# Patient Record
Sex: Male | Born: 1950
Health system: Southern US, Community
[De-identification: ages and names within clinical notes are randomized; demographics above are authoritative.]

## PROBLEM LIST (undated history)

## (undated) DIAGNOSIS — F419 Anxiety disorder, unspecified: Secondary | ICD-10-CM

## (undated) DIAGNOSIS — G4733 Obstructive sleep apnea (adult) (pediatric): Secondary | ICD-10-CM

## (undated) DIAGNOSIS — J449 Chronic obstructive pulmonary disease, unspecified: Secondary | ICD-10-CM

## (undated) DIAGNOSIS — I10 Essential (primary) hypertension: Secondary | ICD-10-CM

---

## 2001-05-22 ENCOUNTER — Ambulatory Visit (HOSPITAL_COMMUNITY): Admission: RE | Admit: 2001-05-22 | Discharge: 2001-05-22 | Payer: Self-pay | Admitting: Internal Medicine

## 2005-04-29 ENCOUNTER — Ambulatory Visit (HOSPITAL_COMMUNITY): Admission: RE | Admit: 2005-04-29 | Discharge: 2005-04-29 | Payer: Self-pay | Admitting: Neurosurgery

## 2016-12-27 DIAGNOSIS — I10 Essential (primary) hypertension: Secondary | ICD-10-CM | POA: Diagnosis not present

## 2016-12-27 DIAGNOSIS — E782 Mixed hyperlipidemia: Secondary | ICD-10-CM | POA: Diagnosis not present

## 2016-12-27 DIAGNOSIS — K219 Gastro-esophageal reflux disease without esophagitis: Secondary | ICD-10-CM | POA: Diagnosis not present

## 2016-12-27 DIAGNOSIS — J449 Chronic obstructive pulmonary disease, unspecified: Secondary | ICD-10-CM | POA: Diagnosis not present

## 2017-01-02 DIAGNOSIS — J441 Chronic obstructive pulmonary disease with (acute) exacerbation: Secondary | ICD-10-CM | POA: Diagnosis not present

## 2017-05-03 DIAGNOSIS — J441 Chronic obstructive pulmonary disease with (acute) exacerbation: Secondary | ICD-10-CM | POA: Diagnosis not present

## 2017-12-27 DIAGNOSIS — J449 Chronic obstructive pulmonary disease, unspecified: Secondary | ICD-10-CM | POA: Diagnosis not present

## 2017-12-27 DIAGNOSIS — K219 Gastro-esophageal reflux disease without esophagitis: Secondary | ICD-10-CM | POA: Diagnosis not present

## 2017-12-27 DIAGNOSIS — E782 Mixed hyperlipidemia: Secondary | ICD-10-CM | POA: Diagnosis not present

## 2017-12-27 DIAGNOSIS — I10 Essential (primary) hypertension: Secondary | ICD-10-CM | POA: Diagnosis not present

## 2017-12-27 DIAGNOSIS — G4733 Obstructive sleep apnea (adult) (pediatric): Secondary | ICD-10-CM | POA: Diagnosis not present

## 2018-05-21 DIAGNOSIS — M25512 Pain in left shoulder: Secondary | ICD-10-CM | POA: Diagnosis not present

## 2018-07-03 DIAGNOSIS — K219 Gastro-esophageal reflux disease without esophagitis: Secondary | ICD-10-CM | POA: Diagnosis not present

## 2018-07-03 DIAGNOSIS — G4733 Obstructive sleep apnea (adult) (pediatric): Secondary | ICD-10-CM | POA: Diagnosis not present

## 2018-07-03 DIAGNOSIS — Z23 Encounter for immunization: Secondary | ICD-10-CM | POA: Diagnosis not present

## 2018-07-03 DIAGNOSIS — J449 Chronic obstructive pulmonary disease, unspecified: Secondary | ICD-10-CM | POA: Diagnosis not present

## 2018-07-03 DIAGNOSIS — I10 Essential (primary) hypertension: Secondary | ICD-10-CM | POA: Diagnosis not present

## 2018-07-03 DIAGNOSIS — E782 Mixed hyperlipidemia: Secondary | ICD-10-CM | POA: Diagnosis not present

## 2018-07-17 DIAGNOSIS — J441 Chronic obstructive pulmonary disease with (acute) exacerbation: Secondary | ICD-10-CM | POA: Diagnosis not present

## 2018-07-17 DIAGNOSIS — H00014 Hordeolum externum left upper eyelid: Secondary | ICD-10-CM | POA: Diagnosis not present

## 2018-07-22 DIAGNOSIS — M7981 Nontraumatic hematoma of soft tissue: Secondary | ICD-10-CM | POA: Diagnosis not present

## 2018-07-22 DIAGNOSIS — R109 Unspecified abdominal pain: Secondary | ICD-10-CM | POA: Diagnosis not present

## 2018-07-22 DIAGNOSIS — S301XXA Contusion of abdominal wall, initial encounter: Secondary | ICD-10-CM | POA: Diagnosis not present

## 2018-09-13 DIAGNOSIS — Z122 Encounter for screening for malignant neoplasm of respiratory organs: Secondary | ICD-10-CM | POA: Diagnosis not present

## 2018-09-13 DIAGNOSIS — I2510 Atherosclerotic heart disease of native coronary artery without angina pectoris: Secondary | ICD-10-CM | POA: Diagnosis not present

## 2018-09-13 DIAGNOSIS — Z87891 Personal history of nicotine dependence: Secondary | ICD-10-CM | POA: Diagnosis not present

## 2018-09-13 DIAGNOSIS — J439 Emphysema, unspecified: Secondary | ICD-10-CM | POA: Diagnosis not present

## 2018-11-06 DIAGNOSIS — R0602 Shortness of breath: Secondary | ICD-10-CM | POA: Diagnosis not present

## 2018-11-06 DIAGNOSIS — J449 Chronic obstructive pulmonary disease, unspecified: Secondary | ICD-10-CM | POA: Diagnosis not present

## 2018-11-06 DIAGNOSIS — J9611 Chronic respiratory failure with hypoxia: Secondary | ICD-10-CM | POA: Diagnosis not present

## 2018-11-14 DIAGNOSIS — J449 Chronic obstructive pulmonary disease, unspecified: Secondary | ICD-10-CM | POA: Diagnosis not present

## 2018-11-14 DIAGNOSIS — J9611 Chronic respiratory failure with hypoxia: Secondary | ICD-10-CM | POA: Diagnosis not present

## 2018-11-16 DIAGNOSIS — L0231 Cutaneous abscess of buttock: Secondary | ICD-10-CM | POA: Diagnosis not present

## 2018-12-13 DIAGNOSIS — J9611 Chronic respiratory failure with hypoxia: Secondary | ICD-10-CM | POA: Diagnosis not present

## 2018-12-13 DIAGNOSIS — J449 Chronic obstructive pulmonary disease, unspecified: Secondary | ICD-10-CM | POA: Diagnosis not present

## 2018-12-15 DIAGNOSIS — J9611 Chronic respiratory failure with hypoxia: Secondary | ICD-10-CM | POA: Diagnosis not present

## 2018-12-15 DIAGNOSIS — J449 Chronic obstructive pulmonary disease, unspecified: Secondary | ICD-10-CM | POA: Diagnosis not present

## 2019-01-11 ENCOUNTER — Encounter: Payer: Self-pay | Admitting: Pulmonary Disease

## 2019-01-11 ENCOUNTER — Ambulatory Visit (INDEPENDENT_AMBULATORY_CARE_PROVIDER_SITE_OTHER): Payer: Medicare Other

## 2019-01-11 ENCOUNTER — Ambulatory Visit (INDEPENDENT_AMBULATORY_CARE_PROVIDER_SITE_OTHER): Payer: Medicare Other | Admitting: Pulmonary Disease

## 2019-01-11 ENCOUNTER — Other Ambulatory Visit: Payer: Self-pay

## 2019-01-11 VITALS — BP 130/80 | HR 75 | Ht 68.0 in | Wt 202.6 lb

## 2019-01-11 DIAGNOSIS — J449 Chronic obstructive pulmonary disease, unspecified: Secondary | ICD-10-CM

## 2019-01-11 MED ORDER — FLUTICASONE-UMECLIDIN-VILANT 100-62.5-25 MCG/INH IN AEPB: 1.0000 | INHALATION_SPRAY | Freq: Every day | RESPIRATORY_TRACT | 5 refills | Status: AC

## 2019-01-11 MED ORDER — FLUTICASONE-UMECLIDIN-VILANT 100-62.5-25 MCG/INH IN AEPB: 1.0000 | INHALATION_SPRAY | Freq: Every day | RESPIRATORY_TRACT | 0 refills | Status: AC

## 2019-01-11 MED ORDER — PREDNISONE 10 MG PO TABS: ORAL_TABLET | ORAL | 0 refills | Status: AC

## 2019-01-11 MED ORDER — NICOTINE 21-14-7 MG/24HR TD KIT: PACK | TRANSDERMAL | 0 refills | Status: AC

## 2019-01-11 NOTE — Progress Notes (Signed)
Brent Carter    [id number]    07-31-1951  Primary Care Physician:System, Pcp Not In  Referring Physician: Gus Height, PA-C [address] [place], [place] [address]  Chief complaint: Consult for COPD  HPI: 68 year old active smoker with history of COPD, hypertension, hyperlipidemia, peripheral vascular disease, diverticulitis sleep apnea Complains of dyspnea on exertion, chronic cough with clear mucus.  Denies any fevers, chills, recent travel.  Per primary note he has tried and failed multiple inhalers including Trelegy, Tudorza, duo nebs, Anoro, Ventolin, Spiriva and Yupelri.  Currently on Dulera and pro-air.  Use the rescue medication several times a day  Has generalized anxiety disorder.  His anxiety tends to make his breathing worse Continues to smoke half pack per day. Has history of sleep apnea but does not want to use the CPAP.  He uses oxygen at night and occasionally during daytime.  Pets: No pets Occupation: Runs a towing company Exposures: No known exposures, no mold, hot tub, Jacuzzi Smoking history: 70-100-pack-year smoker.  Continues to smoke half pack per day Travel history: No significant travel history Relevant family history: No significant family history of lung disease.  Outpatient Encounter Medications as of 01/11/2019  Medication Sig  . albuterol (PROAIR HFA) 108 (90 Base) MCG/ACT inhaler Inhale into the lungs every 6 (six) hours as needed for wheezing or shortness of breath.  Marland Kitchen buPROPion (WELLBUTRIN XL) 150 MG 24 hr tablet Take 150 mg by mouth daily.  . clopidogrel (PLAVIX) 75 MG tablet Take 75 mg by mouth daily.  Marland Kitchen losartan-hydrochlorothiazide (HYZAAR) 100-25 MG tablet Take 1 tablet by mouth daily.  . mometasone-formoterol (DULERA) 200-5 MCG/ACT AERO Inhale 2 puffs into the lungs 2 (two) times daily.  . Omega-3 Fatty Acids (FISH OIL) 1000 MG CAPS Take by mouth daily.  . simethicone (MYLICON) 125 MG chewable tablet Chew 125 mg by mouth  every 6 (six) hours as needed for flatulence.   No facility-administered encounter medications on file as of 01/11/2019.     Allergies as of 01/11/2019  . (No Known Allergies)    No past medical history on file.  History reviewed. No pertinent surgical history.  No family history on file.  Social History   Socioeconomic History  . Marital status: Single    Spouse name: Not on file  . Number of children: Not on file  . Years of education: Not on file  . Highest education level: Not on file  Occupational History  . Not on file  Social Needs  . Financial resource strain: Not on file  . Food insecurity:    Worry: Not on file    Inability: Not on file  . Transportation needs:    Medical: Not on file    Non-medical: Not on file  Tobacco Use  . Smoking status: Current Every Day Smoker    Packs/day: 0.50    Years: 56.00    Pack years: 28.00    Types: Cigarettes  . Smokeless tobacco: Never Used  . Tobacco comment: started at 68 yrs old  Substance and Sexual Activity  . Alcohol use: Not on file  . Drug use: Not on file  . Sexual activity: Not on file  Lifestyle  . Physical activity:    Days per week: Not on file    Minutes per session: Not on file  . Stress: Not on file  Relationships  . Social connections:    Talks on phone: Not on file  Gets together: Not on file    Attends religious service: Not on file    Active member of club or organization: Not on file    Attends meetings of clubs or organizations: Not on file    Relationship status: Not on file  . Intimate partner violence:    Fear of current or ex partner: Not on file    Emotionally abused: Not on file    Physically abused: Not on file    Forced sexual activity: Not on file  Other Topics Concern  . Not on file  Social History Narrative  . Not on file    Review of systems: Review of Systems  Constitutional: Negative for fever and chills.  HENT: Negative.   Eyes: Negative for blurred vision.   Respiratory: as per HPI  Cardiovascular: Negative for chest pain and palpitations.  Gastrointestinal: Negative for vomiting, diarrhea, blood per rectum. Genitourinary: Negative for dysuria, urgency, frequency and hematuria.  Musculoskeletal: Negative for myalgias, back pain and joint pain.  Skin: Negative for itching and rash.  Neurological: Negative for dizziness, tremors, focal weakness, seizures and loss of consciousness.  Endo/Heme/Allergies: Negative for environmental allergies.  Psychiatric/Behavioral: Negative for depression, suicidal ideas and hallucinations.  All other systems reviewed and are negative.  Physical Exam: Blood pressure 130/80, pulse 75, height 5\' 8"  (1.727 m), weight 202 lb 9.6 oz (91.9 kg), SpO2 95 %. Gen:      No acute distress HEENT:  EOMI, sclera anicteric Neck:     No masses; no thyromegaly Lungs:    Scattered expiratory wheeze CV:         Regular rate and rhythm; no murmurs Abd:      + bowel sounds; soft, non-tender; no palpable masses, no distension Ext:    No edema; adequate peripheral perfusion Skin:      Warm and dry; no rash Neuro: alert and oriented x 3 Psych: normal mood and affect  Data Reviewed: Imaging: Screening CT chest 09/13/2018-severe centrilobular emphysema, bronchial wall thickening.  Scattered pulmonary nodules.  Largest is 5.6 mm and right lower lobe.  I have reviewed the images personally.   Assessment:  COPD Likely has severe COPD based on symptoms and CT findings of emphysema He has slight wheeze on examination today.  We will give him a prednisone taper and will retry him on Trelegy inhaler We will schedule him for PFTs but these will likely not get done immediately due to COVID restrictions. Get chest x-ray today  Active smoker Smoking cessation strongly encouraged.  He had tried Chantix and Wellbutrin in the past.  Will prescribe nicotine patches.  Reassess at return visit.  Time spent counseling- 5 mins  Lung nodule  Annual screening CT of the chest.  Plan/Recommendations: - PFTs, chest x-ray - Prednisone taper - Trelegy inhaler - Smoking cessation with nicotine patches.  Chilton GreathousePraveen Alaisha Eversley MD [facility] 01/11/2019, 11:14 AM  CC: Gus HeightJohnson, Andrea, PA-C

## 2019-01-11 NOTE — Addendum Note (Signed)
Addended by: Jacquiline Doe on: 01/11/2019 11:55 AM   Modules accepted: Orders

## 2019-01-11 NOTE — Addendum Note (Signed)
Addended by: Jacquiline Doe on: 01/11/2019 11:47 AM   Modules accepted: Orders

## 2019-01-11 NOTE — Patient Instructions (Signed)
We will schedule you for PFTs but this may take a while to schedule due to covid restrictions We will get a chest x-ray today We will give a prednisone taper starting at 40 mg.  Reduce dose by 10 mg every 3 days We will retry you on Trelegy inhaler  Prescribe nicotine patches to help with smoking cessation Follow-up in 1 to 2 months.

## 2019-01-14 DIAGNOSIS — J9611 Chronic respiratory failure with hypoxia: Secondary | ICD-10-CM | POA: Diagnosis not present

## 2019-01-14 DIAGNOSIS — J449 Chronic obstructive pulmonary disease, unspecified: Secondary | ICD-10-CM | POA: Diagnosis not present

## 2019-01-15 DIAGNOSIS — J449 Chronic obstructive pulmonary disease, unspecified: Secondary | ICD-10-CM | POA: Diagnosis not present

## 2019-01-28 DIAGNOSIS — J449 Chronic obstructive pulmonary disease, unspecified: Secondary | ICD-10-CM | POA: Diagnosis not present

## 2019-01-30 DIAGNOSIS — J449 Chronic obstructive pulmonary disease, unspecified: Secondary | ICD-10-CM | POA: Diagnosis not present

## 2019-01-30 DIAGNOSIS — R072 Precordial pain: Secondary | ICD-10-CM | POA: Diagnosis not present

## 2019-01-30 DIAGNOSIS — E782 Mixed hyperlipidemia: Secondary | ICD-10-CM | POA: Diagnosis not present

## 2019-01-30 DIAGNOSIS — I10 Essential (primary) hypertension: Secondary | ICD-10-CM | POA: Diagnosis not present

## 2019-02-08 DIAGNOSIS — R570 Cardiogenic shock: Secondary | ICD-10-CM | POA: Diagnosis not present

## 2019-02-08 DIAGNOSIS — I10 Essential (primary) hypertension: Secondary | ICD-10-CM | POA: Diagnosis not present

## 2019-02-08 DIAGNOSIS — R0689 Other abnormalities of breathing: Secondary | ICD-10-CM | POA: Diagnosis not present

## 2019-02-08 DIAGNOSIS — Z9981 Dependence on supplemental oxygen: Secondary | ICD-10-CM | POA: Diagnosis not present

## 2019-02-08 DIAGNOSIS — E785 Hyperlipidemia, unspecified: Secondary | ICD-10-CM | POA: Diagnosis not present

## 2019-02-08 DIAGNOSIS — J449 Chronic obstructive pulmonary disease, unspecified: Secondary | ICD-10-CM | POA: Diagnosis not present

## 2019-02-08 DIAGNOSIS — R0602 Shortness of breath: Secondary | ICD-10-CM | POA: Diagnosis not present

## 2019-02-08 DIAGNOSIS — Z7902 Long term (current) use of antithrombotics/antiplatelets: Secondary | ICD-10-CM | POA: Diagnosis not present

## 2019-02-08 DIAGNOSIS — R0682 Tachypnea, not elsewhere classified: Secondary | ICD-10-CM | POA: Diagnosis not present

## 2019-02-08 DIAGNOSIS — R404 Transient alteration of awareness: Secondary | ICD-10-CM | POA: Diagnosis not present

## 2019-02-08 DIAGNOSIS — Z03818 Encounter for observation for suspected exposure to other biological agents ruled out: Secondary | ICD-10-CM | POA: Diagnosis not present

## 2019-02-08 DIAGNOSIS — Z79899 Other long term (current) drug therapy: Secondary | ICD-10-CM | POA: Diagnosis not present

## 2019-02-08 DIAGNOSIS — R000 Tachycardia, unspecified: Secondary | ICD-10-CM | POA: Diagnosis not present

## 2019-02-08 DIAGNOSIS — I2101 ST elevation (STEMI) myocardial infarction involving left main coronary artery: Secondary | ICD-10-CM | POA: Diagnosis not present

## 2019-02-08 DIAGNOSIS — J9601 Acute respiratory failure with hypoxia: Secondary | ICD-10-CM | POA: Diagnosis not present

## 2019-02-08 DIAGNOSIS — I739 Peripheral vascular disease, unspecified: Secondary | ICD-10-CM | POA: Diagnosis not present

## 2019-02-09 ENCOUNTER — Inpatient Hospital Stay (HOSPITAL_COMMUNITY): Payer: Medicare Other

## 2019-02-09 ENCOUNTER — Encounter (HOSPITAL_COMMUNITY): Payer: Self-pay | Admitting: Pulmonary Disease

## 2019-02-09 ENCOUNTER — Other Ambulatory Visit (HOSPITAL_COMMUNITY): Payer: Medicare Other

## 2019-02-09 ENCOUNTER — Other Ambulatory Visit: Payer: Self-pay

## 2019-02-09 ENCOUNTER — Inpatient Hospital Stay (HOSPITAL_COMMUNITY): Admission: AD | Disposition: E | Payer: Self-pay | Source: Other Acute Inpatient Hospital | Attending: Pulmonary Disease

## 2019-02-09 DIAGNOSIS — I130 Hypertensive heart and chronic kidney disease with heart failure and stage 1 through stage 4 chronic kidney disease, or unspecified chronic kidney disease: Secondary | ICD-10-CM | POA: Diagnosis present

## 2019-02-09 DIAGNOSIS — I213 ST elevation (STEMI) myocardial infarction of unspecified site: Secondary | ICD-10-CM

## 2019-02-09 DIAGNOSIS — G4733 Obstructive sleep apnea (adult) (pediatric): Secondary | ICD-10-CM | POA: Diagnosis not present

## 2019-02-09 DIAGNOSIS — J811 Chronic pulmonary edema: Secondary | ICD-10-CM | POA: Diagnosis not present

## 2019-02-09 DIAGNOSIS — Y848 Other medical procedures as the cause of abnormal reaction of the patient, or of later complication, without mention of misadventure at the time of the procedure: Secondary | ICD-10-CM | POA: Diagnosis not present

## 2019-02-09 DIAGNOSIS — D62 Acute posthemorrhagic anemia: Secondary | ICD-10-CM | POA: Diagnosis not present

## 2019-02-09 DIAGNOSIS — Z978 Presence of other specified devices: Secondary | ICD-10-CM | POA: Diagnosis not present

## 2019-02-09 DIAGNOSIS — Z9282 Status post administration of tPA (rtPA) in a different facility within the last 24 hours prior to admission to current facility: Secondary | ICD-10-CM

## 2019-02-09 DIAGNOSIS — R9431 Abnormal electrocardiogram [ECG] [EKG]: Secondary | ICD-10-CM

## 2019-02-09 DIAGNOSIS — I5021 Acute systolic (congestive) heart failure: Secondary | ICD-10-CM

## 2019-02-09 DIAGNOSIS — Z9289 Personal history of other medical treatment: Secondary | ICD-10-CM

## 2019-02-09 DIAGNOSIS — Y712 Prosthetic and other implants, materials and accessory cardiovascular devices associated with adverse incidents: Secondary | ICD-10-CM | POA: Diagnosis not present

## 2019-02-09 DIAGNOSIS — E785 Hyperlipidemia, unspecified: Secondary | ICD-10-CM | POA: Diagnosis present

## 2019-02-09 DIAGNOSIS — J810 Acute pulmonary edema: Secondary | ICD-10-CM | POA: Diagnosis not present

## 2019-02-09 DIAGNOSIS — R570 Cardiogenic shock: Secondary | ICD-10-CM | POA: Diagnosis not present

## 2019-02-09 DIAGNOSIS — Z7902 Long term (current) use of antithrombotics/antiplatelets: Secondary | ICD-10-CM | POA: Diagnosis not present

## 2019-02-09 DIAGNOSIS — I739 Peripheral vascular disease, unspecified: Secondary | ICD-10-CM | POA: Diagnosis present

## 2019-02-09 DIAGNOSIS — T82838A Hemorrhage of vascular prosthetic devices, implants and grafts, initial encounter: Secondary | ICD-10-CM | POA: Diagnosis not present

## 2019-02-09 DIAGNOSIS — Z01818 Encounter for other preprocedural examination: Secondary | ICD-10-CM

## 2019-02-09 DIAGNOSIS — Z8674 Personal history of sudden cardiac arrest: Secondary | ICD-10-CM

## 2019-02-09 DIAGNOSIS — J9601 Acute respiratory failure with hypoxia: Secondary | ICD-10-CM | POA: Diagnosis not present

## 2019-02-09 DIAGNOSIS — F419 Anxiety disorder, unspecified: Secondary | ICD-10-CM | POA: Diagnosis present

## 2019-02-09 DIAGNOSIS — Z79899 Other long term (current) drug therapy: Secondary | ICD-10-CM | POA: Diagnosis not present

## 2019-02-09 DIAGNOSIS — J189 Pneumonia, unspecified organism: Secondary | ICD-10-CM | POA: Diagnosis not present

## 2019-02-09 DIAGNOSIS — N183 Chronic kidney disease, stage 3 (moderate): Secondary | ICD-10-CM | POA: Diagnosis not present

## 2019-02-09 DIAGNOSIS — Z9981 Dependence on supplemental oxygen: Secondary | ICD-10-CM | POA: Diagnosis not present

## 2019-02-09 DIAGNOSIS — J90 Pleural effusion, not elsewhere classified: Secondary | ICD-10-CM | POA: Diagnosis not present

## 2019-02-09 DIAGNOSIS — N179 Acute kidney failure, unspecified: Secondary | ICD-10-CM | POA: Diagnosis not present

## 2019-02-09 DIAGNOSIS — I2109 ST elevation (STEMI) myocardial infarction involving other coronary artery of anterior wall: Principal | ICD-10-CM | POA: Diagnosis present

## 2019-02-09 DIAGNOSIS — J441 Chronic obstructive pulmonary disease with (acute) exacerbation: Secondary | ICD-10-CM | POA: Diagnosis not present

## 2019-02-09 DIAGNOSIS — R0602 Shortness of breath: Secondary | ICD-10-CM | POA: Diagnosis not present

## 2019-02-09 DIAGNOSIS — Z4682 Encounter for fitting and adjustment of non-vascular catheter: Secondary | ICD-10-CM | POA: Diagnosis not present

## 2019-02-09 DIAGNOSIS — F17210 Nicotine dependence, cigarettes, uncomplicated: Secondary | ICD-10-CM | POA: Diagnosis present

## 2019-02-09 DIAGNOSIS — I2510 Atherosclerotic heart disease of native coronary artery without angina pectoris: Secondary | ICD-10-CM | POA: Diagnosis not present

## 2019-02-09 DIAGNOSIS — T829XXA Unspecified complication of cardiac and vascular prosthetic device, implant and graft, initial encounter: Secondary | ICD-10-CM

## 2019-02-09 DIAGNOSIS — J9602 Acute respiratory failure with hypercapnia: Secondary | ICD-10-CM | POA: Diagnosis not present

## 2019-02-09 DIAGNOSIS — J984 Other disorders of lung: Secondary | ICD-10-CM | POA: Diagnosis not present

## 2019-02-09 DIAGNOSIS — R0603 Acute respiratory distress: Secondary | ICD-10-CM

## 2019-02-09 DIAGNOSIS — I255 Ischemic cardiomyopathy: Secondary | ICD-10-CM | POA: Diagnosis present

## 2019-02-09 DIAGNOSIS — I2102 ST elevation (STEMI) myocardial infarction involving left anterior descending coronary artery: Secondary | ICD-10-CM | POA: Diagnosis not present

## 2019-02-09 DIAGNOSIS — E876 Hypokalemia: Secondary | ICD-10-CM | POA: Diagnosis not present

## 2019-02-09 DIAGNOSIS — I2101 ST elevation (STEMI) myocardial infarction involving left main coronary artery: Secondary | ICD-10-CM | POA: Diagnosis not present

## 2019-02-09 DIAGNOSIS — J449 Chronic obstructive pulmonary disease, unspecified: Secondary | ICD-10-CM | POA: Diagnosis not present

## 2019-02-09 DIAGNOSIS — Z7951 Long term (current) use of inhaled steroids: Secondary | ICD-10-CM

## 2019-02-09 DIAGNOSIS — J9690 Respiratory failure, unspecified, unspecified whether with hypoxia or hypercapnia: Secondary | ICD-10-CM | POA: Diagnosis not present

## 2019-02-09 DIAGNOSIS — J9811 Atelectasis: Secondary | ICD-10-CM | POA: Diagnosis not present

## 2019-02-09 DIAGNOSIS — D6489 Other specified anemias: Secondary | ICD-10-CM | POA: Diagnosis not present

## 2019-02-09 DIAGNOSIS — I517 Cardiomegaly: Secondary | ICD-10-CM | POA: Diagnosis not present

## 2019-02-09 DIAGNOSIS — J9611 Chronic respiratory failure with hypoxia: Secondary | ICD-10-CM | POA: Diagnosis not present

## 2019-02-09 DIAGNOSIS — I469 Cardiac arrest, cause unspecified: Secondary | ICD-10-CM | POA: Diagnosis not present

## 2019-02-09 DIAGNOSIS — J432 Centrilobular emphysema: Secondary | ICD-10-CM | POA: Diagnosis not present

## 2019-02-09 DIAGNOSIS — Z452 Encounter for adjustment and management of vascular access device: Secondary | ICD-10-CM | POA: Diagnosis not present

## 2019-02-09 HISTORY — DX: Chronic obstructive pulmonary disease, unspecified: J44.9

## 2019-02-09 HISTORY — PX: LEFT HEART CATH AND CORONARY ANGIOGRAPHY: CATH118249

## 2019-02-09 HISTORY — DX: Anxiety disorder, unspecified: F41.9

## 2019-02-09 HISTORY — DX: Essential (primary) hypertension: I10

## 2019-02-09 HISTORY — DX: Obstructive sleep apnea (adult) (pediatric): G47.33

## 2019-02-09 LAB — COOXEMETRY PANEL
Carboxyhemoglobin: 1.1 % (ref 0.5–1.5)
Carboxyhemoglobin: 1.6 % — ABNORMAL HIGH (ref 0.5–1.5)
Methemoglobin: 0.8 % (ref 0.0–1.5)
Methemoglobin: 0.9 % (ref 0.0–1.5)
O2 Saturation: 89.7 %
O2 Saturation: 73.1 %
Total hemoglobin: 14.2 g/dL (ref 12.0–16.0)
Total hemoglobin: 13.7 g/dL (ref 12.0–16.0)

## 2019-02-09 LAB — PROTIME-INR
INR: 1.3 — ABNORMAL HIGH (ref 0.8–1.2)
Prothrombin Time: 16.2 seconds — ABNORMAL HIGH (ref 11.4–15.2)

## 2019-02-09 LAB — COMPREHENSIVE METABOLIC PANEL
ALT: 186 U/L — ABNORMAL HIGH (ref 0–44)
AST: 204 U/L — ABNORMAL HIGH (ref 15–41)
Albumin: 2.9 g/dL — ABNORMAL LOW (ref 3.5–5.0)
Alkaline Phosphatase: 80 U/L (ref 38–126)
Anion gap: 12 (ref 5–15)
BUN: 21 mg/dL (ref 8–23)
CO2: 22 mmol/L (ref 22–32)
Calcium: 8.4 mg/dL — ABNORMAL LOW (ref 8.9–10.3)
Chloride: 100 mmol/L (ref 98–111)
Creatinine, Ser: 1.51 mg/dL — ABNORMAL HIGH (ref 0.61–1.24)
GFR calc Af Amer: 54 mL/min — ABNORMAL LOW (ref >60)
GFR calc non Af Amer: 46 mL/min — ABNORMAL LOW (ref >60)
Glucose, Bld: 143 mg/dL — ABNORMAL HIGH (ref 70–99)
Potassium: 4.3 mmol/L (ref 3.5–5.1)
Sodium: 134 mmol/L — ABNORMAL LOW (ref 135–145)
Total Bilirubin: 1 mg/dL (ref 0.3–1.2)
Total Protein: 5.7 g/dL — ABNORMAL LOW (ref 6.5–8.1)

## 2019-02-09 LAB — URINALYSIS, MICROSCOPIC (REFLEX): Squamous Epithelial / HPF: NONE SEEN (ref 0–5)

## 2019-02-09 LAB — HIV ANTIBODY (ROUTINE TESTING W REFLEX): HIV Screen 4th Generation wRfx: NONREACTIVE

## 2019-02-09 LAB — CBC WITH DIFFERENTIAL/PLATELET
Abs Immature Granulocytes: 0.17 10*3/uL — ABNORMAL HIGH (ref 0.00–0.07)
Basophils Absolute: 0 10*3/uL (ref 0.0–0.1)
Basophils Relative: 0 %
Eosinophils Absolute: 0 10*3/uL (ref 0.0–0.5)
Eosinophils Relative: 0 %
HCT: 43 % (ref 39.0–52.0)
Hemoglobin: 14 g/dL (ref 13.0–17.0)
Immature Granulocytes: 1 %
Lymphocytes Relative: 5 %
Lymphs Abs: 0.8 10*3/uL (ref 0.7–4.0)
MCH: 30.4 pg (ref 26.0–34.0)
MCHC: 32.6 g/dL (ref 30.0–36.0)
MCV: 93.5 fL (ref 80.0–100.0)
Monocytes Absolute: 0.9 10*3/uL (ref 0.1–1.0)
Monocytes Relative: 5 %
Neutro Abs: 16.1 10*3/uL — ABNORMAL HIGH (ref 1.7–7.7)
Neutrophils Relative %: 89 %
Platelets: 270 10*3/uL (ref 150–400)
RBC: 4.6 MIL/uL (ref 4.22–5.81)
RDW: 13.6 % (ref 11.5–15.5)
WBC: 18 10*3/uL — ABNORMAL HIGH (ref 4.0–10.5)
nRBC: 0 % (ref 0.0–0.2)

## 2019-02-09 LAB — LIPID PANEL
Cholesterol: 115 mg/dL (ref 0–200)
HDL: 36 mg/dL — ABNORMAL LOW (ref >40)
LDL Cholesterol: 71 mg/dL (ref 0–99)
Total CHOL/HDL Ratio: 3.2 RATIO
Triglycerides: 42 mg/dL (ref <150)
VLDL: 8 mg/dL (ref 0–40)

## 2019-02-09 LAB — MAGNESIUM: Magnesium: 2 mg/dL (ref 1.7–2.4)

## 2019-02-09 LAB — POCT I-STAT EG7
Acid-base deficit: 4 mmol/L — ABNORMAL HIGH (ref 0.0–2.0)
Bicarbonate: 24.1 mmol/L (ref 20.0–28.0)
Calcium, Ion: 1.2 mmol/L (ref 1.15–1.40)
HCT: 42 % (ref 39.0–52.0)
Hemoglobin: 14.3 g/dL (ref 13.0–17.0)
O2 Saturation: 66 %
Patient temperature: 99
Potassium: 4.4 mmol/L (ref 3.5–5.1)
Sodium: 135 mmol/L (ref 135–145)
TCO2: 26 mmol/L (ref 22–32)
pCO2, Ven: 54 mmHg (ref 44.0–60.0)
pH, Ven: 7.26 (ref 7.250–7.430)
pO2, Ven: 41 mmHg (ref 32.0–45.0)

## 2019-02-09 LAB — URINALYSIS, ROUTINE W REFLEX MICROSCOPIC
Glucose, UA: 100 mg/dL — AB
Ketones, ur: NEGATIVE mg/dL
Nitrite: NEGATIVE
Protein, ur: 100 mg/dL — AB
Specific Gravity, Urine: >1.03 — ABNORMAL HIGH (ref 1.005–1.030)
pH: 5 (ref 5.0–8.0)

## 2019-02-09 LAB — PHOSPHORUS: Phosphorus: 4.5 mg/dL (ref 2.5–4.6)

## 2019-02-09 LAB — BRAIN NATRIURETIC PEPTIDE: B Natriuretic Peptide: 1279 pg/mL — ABNORMAL HIGH (ref 0.0–100.0)

## 2019-02-09 LAB — LACTIC ACID, PLASMA: Lactic Acid, Venous: 1.2 mmol/L (ref 0.5–1.9)

## 2019-02-09 LAB — ECHOCARDIOGRAM COMPLETE
Height: 65 in
Weight: 3446.23 oz

## 2019-02-09 LAB — HEMOGLOBIN A1C
Hgb A1c MFr Bld: 5.6 % (ref 4.8–5.6)
Mean Plasma Glucose: 114.02 mg/dL

## 2019-02-09 LAB — TRIGLYCERIDES: Triglycerides: 44 mg/dL (ref <150)

## 2019-02-09 LAB — HEPARIN LEVEL (UNFRACTIONATED): Heparin Unfractionated: 0.18 IU/mL — ABNORMAL LOW (ref 0.30–0.70)

## 2019-02-09 LAB — TROPONIN I
Troponin I: 42.1 ng/mL (ref <0.03)
Troponin I: 54.52 ng/mL (ref <0.03)

## 2019-02-09 LAB — ACETAMINOPHEN LEVEL: Acetaminophen (Tylenol), Serum: <10 ug/mL — ABNORMAL LOW (ref 10–30)

## 2019-02-09 LAB — MRSA PCR SCREENING: MRSA by PCR: NEGATIVE

## 2019-02-09 LAB — APTT: aPTT: 54 seconds — ABNORMAL HIGH (ref 24–36)

## 2019-02-09 SURGERY — LEFT HEART CATH AND CORONARY ANGIOGRAPHY
Anesthesia: LOCAL

## 2019-02-09 MED ORDER — SODIUM CHLORIDE 0.9% FLUSH: 3.0000 mL | INTRAVENOUS | Status: DC | PRN

## 2019-02-09 MED ORDER — SODIUM CHLORIDE 0.9 % IV SOLN
500.0000 mg | INTRAVENOUS | Status: DC
  Filled 2019-02-09: qty 500

## 2019-02-09 MED ORDER — HEPARIN SODIUM (PORCINE) 1000 UNIT/ML IJ SOLN
INTRAMUSCULAR | Status: DC | PRN
  Administered 2019-02-09: 4000 [IU] via INTRAVENOUS

## 2019-02-09 MED ORDER — ACETAMINOPHEN 325 MG PO TABS: 650.0000 mg | ORAL_TABLET | ORAL | Status: DC | PRN

## 2019-02-09 MED ORDER — PANTOPRAZOLE SODIUM 40 MG PO PACK
40.0000 mg | PACK | Freq: Every day | ORAL | Status: DC
  Administered 2019-02-09: 10:00:00 40 mg
  Filled 2019-02-09: qty 20

## 2019-02-09 MED ORDER — HEPARIN (PORCINE) 25000 UT/250ML-% IV SOLN
1600.0000 [IU]/h | INTRAVENOUS | Status: DC
  Administered 2019-02-09: 1000 [IU]/h via INTRAVENOUS
  Administered 2019-02-10: 14:00:00 1400 [IU]/h via INTRAVENOUS
  Administered 2019-02-11: 08:00:00 1500 [IU]/h via INTRAVENOUS
  Administered 2019-02-12: 1800 [IU]/h via INTRAVENOUS
  Administered 2019-02-12: 1500 [IU]/h via INTRAVENOUS
  Administered 2019-02-13: 1600 [IU]/h via INTRAVENOUS
  Filled 2019-02-09 (×6): qty 250

## 2019-02-09 MED ORDER — SODIUM CHLORIDE 0.9 % IV SOLN
250.0000 mL | INTRAVENOUS | Status: DC | PRN
  Administered 2019-02-09: 250 mL via INTRAVENOUS

## 2019-02-09 MED ORDER — PROPOFOL 1000 MG/100ML IV EMUL: 0.0000 ug/kg/min | INTRAVENOUS | Status: DC

## 2019-02-09 MED ORDER — SODIUM CHLORIDE 0.9 % IV SOLN
INTRAVENOUS | Status: DC
  Administered 2019-02-09 (×3): via INTRAVENOUS

## 2019-02-09 MED ORDER — SODIUM CHLORIDE 0.9 % IV SOLN
1.0000 g | INTRAVENOUS | Status: AC
  Administered 2019-02-09 – 2019-02-12 (×4): 1 g via INTRAVENOUS
  Filled 2019-02-09 (×2): qty 10
  Filled 2019-02-09: qty 1
  Filled 2019-02-09 (×2): qty 10

## 2019-02-09 MED ORDER — "THROMBI-PAD 3""X3"" EX PADS"
1.0000 | MEDICATED_PAD | Freq: Once | CUTANEOUS | Status: AC
  Administered 2019-02-09: 1 via TOPICAL
  Filled 2019-02-09 (×2): qty 1

## 2019-02-09 MED ORDER — BISACODYL 10 MG RE SUPP
10.0000 mg | Freq: Every day | RECTAL | Status: DC | PRN
  Administered 2019-02-13: 18:00:00 10 mg via RECTAL
  Filled 2019-02-09: qty 1

## 2019-02-09 MED ORDER — PREDNISONE 20 MG PO TABS
40.0000 mg | ORAL_TABLET | Freq: Every day | ORAL | Status: DC
  Administered 2019-02-09: 40 mg
  Filled 2019-02-09: qty 2

## 2019-02-09 MED ORDER — LIDOCAINE HCL (PF) 1 % IJ SOLN
INTRAMUSCULAR | Status: DC | PRN
  Administered 2019-02-09: 5 mL via SUBCUTANEOUS

## 2019-02-09 MED ORDER — FENTANYL CITRATE (PF) 100 MCG/2ML IJ SOLN
INTRAMUSCULAR | Status: DC | PRN
  Administered 2019-02-09: 50 ug via INTRAVENOUS

## 2019-02-09 MED ORDER — ALBUTEROL SULFATE (2.5 MG/3ML) 0.083% IN NEBU
2.5000 mg | INHALATION_SOLUTION | RESPIRATORY_TRACT | Status: DC | PRN
  Administered 2019-02-11 – 2019-02-13 (×2): 2.5 mg via RESPIRATORY_TRACT
  Filled 2019-02-09 (×2): qty 3

## 2019-02-09 MED ORDER — SODIUM CHLORIDE 0.9% FLUSH
3.0000 mL | Freq: Two times a day (BID) | INTRAVENOUS | Status: DC
  Administered 2019-02-10 – 2019-02-12 (×2): 3 mL via INTRAVENOUS
  Administered 2019-02-12: 10 mL via INTRAVENOUS
  Administered 2019-02-13: 6 mL via INTRAVENOUS
  Administered 2019-02-13: 22:00:00 3 mL via INTRAVENOUS

## 2019-02-09 MED ORDER — FUROSEMIDE 10 MG/ML IJ SOLN
40.0000 mg | Freq: Two times a day (BID) | INTRAMUSCULAR | Status: DC
  Administered 2019-02-09 – 2019-02-10 (×3): 40 mg via INTRAVENOUS
  Filled 2019-02-09 (×3): qty 4

## 2019-02-09 MED ORDER — LIDOCAINE HCL (PF) 1 % IJ SOLN
INTRAMUSCULAR | Status: AC
  Filled 2019-02-09: qty 30

## 2019-02-09 MED ORDER — DOCUSATE SODIUM 50 MG/5ML PO LIQD
100.0000 mg | Freq: Two times a day (BID) | ORAL | Status: DC | PRN
  Administered 2019-02-13: 09:00:00 100 mg
  Filled 2019-02-09: qty 10

## 2019-02-09 MED ORDER — CHLORHEXIDINE GLUCONATE 0.12% ORAL RINSE (MEDLINE KIT)
15.0000 mL | Freq: Two times a day (BID) | OROMUCOSAL | Status: DC
  Administered 2019-02-09 – 2019-02-10 (×3): 15 mL via OROMUCOSAL

## 2019-02-09 MED ORDER — IPRATROPIUM-ALBUTEROL 0.5-2.5 (3) MG/3ML IN SOLN
3.0000 mL | Freq: Four times a day (QID) | RESPIRATORY_TRACT | Status: DC
  Administered 2019-02-09 – 2019-02-10 (×8): 3 mL via RESPIRATORY_TRACT
  Filled 2019-02-09 (×7): qty 3

## 2019-02-09 MED ORDER — BUPROPION HCL 100 MG PO TABS
50.0000 mg | ORAL_TABLET | Freq: Three times a day (TID) | ORAL | Status: DC
  Administered 2019-02-09 (×3): 50 mg
  Filled 2019-02-09 (×4): qty 0.5

## 2019-02-09 MED ORDER — HEPARIN (PORCINE) 25000 UT/250ML-% IV SOLN
1000.0000 [IU]/h | INTRAVENOUS | Status: DC
  Administered 2019-02-09: 1000 [IU]/h via INTRAVENOUS

## 2019-02-09 MED ORDER — MIDAZOLAM HCL 2 MG/2ML IJ SOLN
1.0000 mg | INTRAMUSCULAR | Status: DC | PRN
  Administered 2019-02-10: 2 mg via INTRAVENOUS
  Filled 2019-02-09: qty 2

## 2019-02-09 MED ORDER — NOREPINEPHRINE 16 MG/250ML-% IV SOLN
0.0000 ug/min | INTRAVENOUS | Status: DC
  Administered 2019-02-09 – 2019-02-12 (×2): 4 ug/min via INTRAVENOUS
  Filled 2019-02-09 (×2): qty 250

## 2019-02-09 MED ORDER — ASPIRIN 81 MG PO CHEW
81.0000 mg | CHEWABLE_TABLET | Freq: Every day | ORAL | Status: DC
  Administered 2019-02-09: 81 mg
  Filled 2019-02-09: qty 1

## 2019-02-09 MED ORDER — NOREPINEPHRINE 4 MG/250ML-% IV SOLN
0.0000 ug/min | INTRAVENOUS | Status: DC
  Administered 2019-02-09: 12 ug/min via INTRAVENOUS

## 2019-02-09 MED ORDER — CLONAZEPAM 0.25 MG PO TBDP
0.2500 mg | ORAL_TABLET | Freq: Every day | ORAL | Status: DC
  Administered 2019-02-09 – 2019-02-13 (×5): 0.25 mg
  Filled 2019-02-09 (×5): qty 1

## 2019-02-09 MED ORDER — NITROGLYCERIN 1 MG/10 ML FOR IR/CATH LAB
INTRA_ARTERIAL | Status: AC
  Filled 2019-02-09: qty 10

## 2019-02-09 MED ORDER — ONDANSETRON HCL 4 MG/2ML IJ SOLN
4.0000 mg | Freq: Four times a day (QID) | INTRAMUSCULAR | Status: DC | PRN
  Administered 2019-02-13: 11:00:00 4 mg via INTRAVENOUS
  Filled 2019-02-09: qty 2

## 2019-02-09 MED ORDER — BUDESONIDE 0.25 MG/2ML IN SUSP
0.2500 mg | Freq: Two times a day (BID) | RESPIRATORY_TRACT | Status: DC
  Administered 2019-02-09 – 2019-02-13 (×10): 0.25 mg via RESPIRATORY_TRACT
  Filled 2019-02-09 (×10): qty 2

## 2019-02-09 MED ORDER — ORAL CARE MOUTH RINSE
15.0000 mL | OROMUCOSAL | Status: DC
  Administered 2019-02-09 – 2019-02-10 (×8): 15 mL via OROMUCOSAL

## 2019-02-09 MED ORDER — FENTANYL CITRATE (PF) 100 MCG/2ML IJ SOLN
25.0000 ug | Freq: Once | INTRAMUSCULAR | Status: AC
  Administered 2019-02-09: 25 ug via INTRAVENOUS

## 2019-02-09 MED ORDER — ATORVASTATIN CALCIUM 80 MG PO TABS
80.0000 mg | ORAL_TABLET | Freq: Every day | ORAL | Status: DC
  Administered 2019-02-09: 80 mg via ORAL
  Filled 2019-02-09: qty 1

## 2019-02-09 MED ORDER — MIDAZOLAM HCL 2 MG/2ML IJ SOLN
INTRAMUSCULAR | Status: DC | PRN
  Administered 2019-02-09: 2 mg via INTRAVENOUS

## 2019-02-09 MED ORDER — HEPARIN (PORCINE) IN NACL 1000-0.9 UT/500ML-% IV SOLN
INTRAVENOUS | Status: DC | PRN
  Administered 2019-02-09 (×2): 500 mL

## 2019-02-09 MED ORDER — MIDAZOLAM HCL 2 MG/2ML IJ SOLN
INTRAMUSCULAR | Status: AC
  Filled 2019-02-09: qty 2

## 2019-02-09 MED ORDER — VERAPAMIL HCL 2.5 MG/ML IV SOLN
INTRAVENOUS | Status: DC | PRN
  Administered 2019-02-09: 10 mL via INTRA_ARTERIAL

## 2019-02-09 MED ORDER — HEPARIN (PORCINE) IN NACL 1000-0.9 UT/500ML-% IV SOLN
INTRAVENOUS | Status: AC
  Filled 2019-02-09: qty 1000

## 2019-02-09 MED ORDER — HYDRALAZINE HCL 20 MG/ML IJ SOLN: 10.0000 mg | INTRAMUSCULAR | Status: AC | PRN

## 2019-02-09 MED ORDER — SODIUM CHLORIDE 0.9 % IV SOLN
100.0000 mg | Freq: Two times a day (BID) | INTRAVENOUS | Status: AC
  Administered 2019-02-09 – 2019-02-13 (×10): 100 mg via INTRAVENOUS
  Filled 2019-02-09 (×10): qty 100

## 2019-02-09 MED ORDER — HEPARIN SODIUM (PORCINE) 1000 UNIT/ML IJ SOLN
INTRAMUSCULAR | Status: AC
  Filled 2019-02-09: qty 1

## 2019-02-09 MED ORDER — PERFLUTREN LIPID MICROSPHERE
4.0000 mL | Freq: Once | INTRAVENOUS | Status: AC
  Administered 2019-02-09: 4 mL via INTRAVENOUS

## 2019-02-09 MED ORDER — ATORVASTATIN CALCIUM 80 MG PO TABS: 80.0000 mg | ORAL_TABLET | Freq: Every day | ORAL | Status: DC

## 2019-02-09 MED ORDER — ARFORMOTEROL TARTRATE 15 MCG/2ML IN NEBU
15.0000 ug | INHALATION_SOLUTION | Freq: Two times a day (BID) | RESPIRATORY_TRACT | Status: DC
  Administered 2019-02-09 – 2019-02-13 (×9): 15 ug via RESPIRATORY_TRACT
  Filled 2019-02-09 (×9): qty 2

## 2019-02-09 MED ORDER — "THROMBI-PAD 3""X3"" EX PADS"
1.0000 | MEDICATED_PAD | Freq: Once | CUTANEOUS | Status: AC
  Administered 2019-02-09: 1 via TOPICAL
  Filled 2019-02-09: qty 1

## 2019-02-09 MED ORDER — FENTANYL 2500MCG IN NS 250ML (10MCG/ML) PREMIX INFUSION
25.0000 ug/h | INTRAVENOUS | Status: DC
  Administered 2019-02-09: 50 ug/h via INTRAVENOUS
  Filled 2019-02-09: qty 250

## 2019-02-09 MED ORDER — ASPIRIN 81 MG PO CHEW
81.0000 mg | CHEWABLE_TABLET | Freq: Every day | ORAL | Status: DC
  Administered 2019-02-10 – 2019-02-11 (×2): 81 mg via ORAL
  Filled 2019-02-09 (×2): qty 1

## 2019-02-09 MED ORDER — VERAPAMIL HCL 2.5 MG/ML IV SOLN
INTRAVENOUS | Status: AC
  Filled 2019-02-09: qty 2

## 2019-02-09 MED ORDER — LABETALOL HCL 5 MG/ML IV SOLN: 10.0000 mg | INTRAVENOUS | Status: AC | PRN

## 2019-02-09 MED ORDER — FENTANYL BOLUS VIA INFUSION
25.0000 ug | INTRAVENOUS | Status: DC | PRN
  Filled 2019-02-09: qty 25

## 2019-02-09 MED ORDER — CLOPIDOGREL BISULFATE 75 MG PO TABS
75.0000 mg | ORAL_TABLET | Freq: Every day | ORAL | Status: DC
  Administered 2019-02-09: 75 mg
  Filled 2019-02-09: qty 1

## 2019-02-09 MED ORDER — CITALOPRAM HYDROBROMIDE 20 MG PO TABS
20.0000 mg | ORAL_TABLET | Freq: Every day | ORAL | Status: DC
  Administered 2019-02-09: 20 mg
  Filled 2019-02-09: qty 1

## 2019-02-09 SURGICAL SUPPLY — 15 items
BAG SNAP BAND KOVER 36X36 (MISCELLANEOUS) ×2 IMPLANT
CATH OPTITORQUE TIG 4.0 5F (CATHETERS) ×2 IMPLANT
COVER DOME SNAP 22 D (MISCELLANEOUS) ×2 IMPLANT
DEVICE RAD COMP TR BAND LRG (VASCULAR PRODUCTS) ×2 IMPLANT
ELECT DEFIB PAD ADLT CADENCE (PAD) ×2 IMPLANT
GLIDESHEATH SLEND SS 6F .021 (SHEATH) ×2 IMPLANT
GUIDEWIRE INQWIRE 1.5J.035X260 (WIRE) ×1 IMPLANT
HOVERMATT SINGLE USE (MISCELLANEOUS) ×2 IMPLANT
INQWIRE 1.5J .035X260CM (WIRE) ×2
KIT ENCORE 26 ADVANTAGE (KITS) ×2 IMPLANT
KIT HEART LEFT (KITS) ×2 IMPLANT
PACK CARDIAC CATHETERIZATION (CUSTOM PROCEDURE TRAY) ×2 IMPLANT
SHEATH PROBE COVER 6X72 (BAG) ×2 IMPLANT
TRANSDUCER W/STOPCOCK (MISCELLANEOUS) ×2 IMPLANT
TUBING CIL FLEX 10 FLL-RA (TUBING) ×2 IMPLANT

## 2019-02-09 NOTE — H&P (Signed)
NAME:  Brent Carter, MRN:  [id number], DOB:  September 06, 1950, LOS: 0 ADMISSION DATE:  02/18/2019, CONSULTATION DATE:  02/22/2019 REFERRING MD:  [facility], CHIEF COMPLAINT:  STEMI  Brief History   68 year old man 50+ packyear active smoker with history of COPD, hypertension, hyperlipidemia, peripheral vascular disease, diverticular disease, sleep apnea (not on CPAP), anxiety presented with dyspnea found to have acute hypoxic and hypercarbic respiratory failure, 4 min cardiac arrest, STEMI s/p TPA on 02/08/2019 at 2030 and transferred here from [place].  History of present illness   68 year old man 15+ packyear active smoker with history of COPD, hypertension, hyperlipidemia, peripheral vascular disease, diverticular disease, sleep apnea (not on CPAP), anxiety presenting with dyspnea found to have STEMI s/p alteplase on 02/08/2019 at 2030 at [place]. Per EMR, patient arrived via EMS with dyspnea and respiratory distress. His dyspnea has been increasing the past 3 weeks and became acutely worse prior to arrival. He was found to be hypoxic with O2 sat in the 80s. He was also found to be wheezing. He was placed on CPAP. He was emergently intubated. No fevers or chest pain.   In the ED, he was found to have ST elevation in AVR with ST depression diffusely otherwise. CXR with RLL opacity. ED discussed with cardiology here and due to the STEMI and possibility of COVID, he was given thrombolytics for his STEMI. COVID testing came back negative. He received alteplase, ASA 370m. He had resolution of the ST changes.  Per [facility] report, he had cardiac arrest and received 4 min of CPR and Epi 159m No defibrillation. Per EMR appears to have had it prior to the alteplase.   He was seen by Dr. MaVaughan Browner/15/2020 for COPD and [facility]  Past Medical History  COPD, hypertension, hyperlipidemia, peripheral vascular disease, diverticular disease, sleep apnea (not on CPAP), anxiety   Significant Hospital Events   6/12>> Presented to ED 6/13>> [facility] 6[facility]>  Procedures:  CVC 6/13>  Significant Diagnostic Tests:  CXR 02/03/2019: asymmetric airspace disease in RLL, hyperinflation. No pneumothorax  EKG 02/08/2019: ST elevation in AVR, ST depression diffusely  OSH labs 02/08/2019 WBC 14.4, Hgb 15.4, Plt 295, ANC 9.22 DDimer 6403 PT 11.3, INR 1.1 APTT 23.3 (_0 ) ABG 7.29/53/406 Na 135, K 4.7, CO2 21, cr 0.9, BUN 15 Troponin 1.32 (_1 ) NT-proBNP 4680 T bili 1, AST 303, ALT 201, alk phos 107 Procal 0.05 SARS-CoV-2 RNA not detected  Micro Data:  BCx 6/13> Tracheal aspirate 6/13>  Antimicrobials:  Ceftriaxone 6/12> Azithromycin 6/12>  Interim history/subjective:  Intubated and sedated  Objective   Blood pressure (!) 116/47, pulse (!) 53, temperature 99 F (37.2 C), temperature source Axillary, resp. rate (!) 24, height 5' 5" (1.651 m), weight 97.7 kg, SpO2 100 %.    Vent Mode: PRVC FiO2 (%):  [40 %-60 %] 40 % Set Rate:  [20 bmp-24 bmp] 24 bmp Vt Set:  [490 mL] 490 mL PEEP:  [5 cmH20] 5 cmH20 Plateau Pressure:  [25 cmH20] 25 cmH20   Intake/Output Summary (Last 24 hours) at 02/21/2019 0606 Last data filed at 01/30/2019 0500 Gross per 24 hour  Intake -  Output 80 ml  Net -80 ml   Filed Weights   02/01/2019 0330 02/08/2019 0355  Weight: 91.9 kg 97.7 kg    Examination: General: NAD HENT: [place]/AT, ETT in place Lungs: Diffuse wheezes bilaterally Cardiovascular: bradycardic but regular Abdomen: Soft, mildly distended Extremities: No LE edema  Neuro: Sedated, moves all extremities to pain GU: Foley in place  Assessment & Plan:  68 year old man 86+ packyear active smoker with history of COPD, hypertension, hyperlipidemia, peripheral vascular disease, diverticular disease, sleep apnea (not on CPAP), anxiety presented with dyspnea found to have acute hypoxic and hypercarbic respiratory failure, 4 min  cardiac arrest, STEMI s/p TPA on 02/08/2019 at 2030 and transferred here from [place].  Acute hypoxic and hypercarbic respiratory failure:  --CXR --Continue vent support --Increased minute ventilation given the hypercarbic respiratory failure. Recheck VBG. --Wean O2 support for goal O2 sat of >88%  CAP, COPD exacerbation: --Brovana, Pulmicort neb BID --Duonebs scheduled and albprn wheezing/dyspnea --Continue ceftriaxone for 5 days and azithromycin 531m for 3 days --Prednisone 445mx 5 days  STEMI/CAD, cardiac arrest: --Cardiology consulted --Heparin gtt, asa, plavix --Hold off on statin for now as he has some transaminitis --TTE  --Lipid panel, A1c --Cycle trops  Shock, septic versus cardiogenic, transaminitis, hx HTN:  --Obtain venous O2 sat from central line --Continue levophed gtt for goal MAP >65 --Hold home antihypertensives --Check hepatitis panel and acetaminophen level, follow CMP --Check lactic acid  Anxiety: --Continue home Wellbutrin, citalopram.  --Continue home Klonopin at half dose to prevent withdrawal   Best practice:  Diet: NPO Pain/Anxiety/Delirium protocol (if indicated): Fent gtt and prn, discontinued versed gtt and can start propofol if needed for goal RASS 0 VAP protocol (if indicated): PPI daily DVT prophylaxis: hep gtt GI prophylaxis: PPI daily Glucose control: Monitor Mobility: OOB when able Code Status: FULL Family Communication: Updated wife and son via phone Disposition: ICU  Labs   CBC: Recent Labs  Lab 02/08/2019 0434 02/15/2019 0523  WBC 18.0*  --   NEUTROABS 16.1*  --   HGB 14.0 14.3  HCT 43.0 42.0  MCV 93.5  --   PLT 270  --     Basic Metabolic Panel: Recent Labs  Lab 02/05/2019 0523  NA 135  K 4.4   Recent Labs  Lab 02/25/2019 0434  WBC 18.0*  LATICACIDVEN 1.2   ABG    Component Value Date/Time   HCO3 24.1 02/01/2019 0523   TCO2 26 01/31/2019 0523   ACIDBASEDEF 4.0 (H) 02/19/2019 0523   O2SAT 66.0 02/23/2019 0523      Review of Systems:   Unable to obtain due to sedation, encephalopathy  Past Medical History  He,  has a past medical history of Anxiety, COPD (chronic obstructive pulmonary disease) (HCEaston Hypertension, and OSA (obstructive sleep apnea).   Surgical History   No past surgical history on file.   Social History   reports that he has been smoking cigarettes. He has a 28.00 pack-year smoking history. He has never used smokeless tobacco.   Family History   His family history is negative for Lung disease.   Allergies No Known Allergies   Home Medications  Prior to Admission medications   Medication Sig Start Date End Date Taking? Authorizing Provider  albuterol (PROAIR HFA) 108 (90 Base) MCG/ACT inhaler Inhale into the lungs every 6 (six) hours as needed for wheezing or shortness of breath.    [provider]  buPROPion (WELLBUTRIN XL) 150 MG 24 hr tablet Take 150 mg by mouth daily.    [provider]  clopidogrel (PLAVIX) 75 MG tablet Take 75 mg by mouth daily.    [provider]  Fluticasone-Umeclidin-Vilant (TRELEGY ELLIPTA) 100-62.5-25 MCG/INH AEPB Inhale 1 puff into the lungs daily. 01/11/19   MaMarshell GarfinkelMD  Fluticasone-Umeclidin-Vilant (TRELEGY ELLIPTA) 100-62.5-25 MCG/INH AEPB  Inhale 1 puff into the lungs daily. 01/11/19   Mannam, Hart Robinsons, MD  losartan-hydrochlorothiazide (HYZAAR) 100-25 MG tablet Take 1 tablet by mouth daily.    [provider]  mometasone-formoterol (DULERA) 200-5 MCG/ACT AERO Inhale 2 puffs into the lungs 2 (two) times daily.    [provider]  Nicotine 21-14-7 MG/24HR KIT Take as directed 01/11/19   Mannam, Hart Robinsons, MD  Omega-3 Fatty Acids (FISH OIL) 1000 MG CAPS Take by mouth daily.    [provider]  predniSONE (DELTASONE) 10 MG tablet 4 tabs x's 3 days,3tabs x's 3days,2tabs x's 3 days,1 tab x's 3 days,then stop 01/11/19   Mannam, Hart Robinsons, MD  simethicone (MYLICON) 098 MG chewable tablet Chew 125 mg  by mouth every 6 (six) hours as needed for flatulence.    [provider]     Critical care time: The patient is critically ill with multiple organ systems failure and requires high complexity decision making for assessment and support, frequent evaluation and titration of therapies, application of advanced monitoring technologies and extensive interpretation of multiple databases.   Critical Care Time devoted to patient care services described in this note is  60 Minutes. This time reflects time of care of this signee. This critical care time does not reflect procedure time, or teaching time or supervisory time of PA/NP/Med student/Med Resident etc but could involve care discussion time.  Jacques Earthly, M.D. [facility] Pulmonary/Critical Care Medicine After hours pager: [phone].

## 2019-02-09 NOTE — Procedures (Signed)
Central Venous Catheter Insertion Procedure Note Brent Carter [id number] Jul 30, 1951  Procedure: Insertion of Central Venous Catheter Indications: Drug and/or fluid administration  Procedure Details Consent: Risks of procedure as well as the alternatives and risks of each were explained to the (patient/caregiver).  Consent for procedure obtained. Time Out: Verified patient identification, verified procedure, site/side was marked, verified correct patient position, special equipment/implants available, medications/allergies/relevent history reviewed, required imaging and test results available.  Performed  Maximum sterile technique was used including antiseptics, cap, gloves, gown, hand hygiene, mask and sheet. Skin prep: Chlorhexidine; local anesthetic administered A antimicrobial bonded/coated triple lumen catheter was placed in the right internal jugular vein using the Seldinger technique under ultrasound guidance.  Evaluation Blood flow good Complications: No apparent complications Patient did tolerate procedure well. Chest X-ray ordered to verify placement.  CXR: pending.  Brent Carter Mar 08, 2019, 5:08 AM

## 2019-02-09 NOTE — Progress Notes (Signed)
NAME:  Brent Carter, MRN:  [id number], DOB:  [birth year], LOS: 0 ADMISSION DATE:  02/08/2019, CONSULTATION DATE:  02/22/2019 REFERRING MD:  [facility], CHIEF COMPLAINT:  STEMI  Brief History   68 year old man 69+ packyear active smoker with history of COPD, hypertension, hyperlipidemia, peripheral vascular disease, diverticular disease, sleep apnea (not on CPAP), anxiety presented with dyspnea found to have acute hypoxic and hypercarbic respiratory failure, 4 min cardiac arrest, STEMI s/p TPA on 02/08/2019 at 2030 and transferred here from [place].  History of present illness   68 year old man 30+ packyear active smoker with history of COPD, hypertension, hyperlipidemia, peripheral vascular disease, diverticular disease, sleep apnea (not on CPAP), anxiety presenting with dyspnea found to have STEMI s/p alteplase on 02/08/2019 at 2030 at [place]. Per EMR, patient arrived via EMS with dyspnea and respiratory distress. His dyspnea has been increasing the past 3 weeks and became acutely worse prior to arrival. He was found to be hypoxic with O2 sat in the 80s. He was also found to be wheezing. He was placed on CPAP. He was emergently intubated. No fevers or chest pain.   In the ED, he was found to have ST elevation in AVR with ST depression diffusely otherwise. CXR with RLL opacity. ED discussed with cardiology here and due to the STEMI and possibility of COVID, he was given thrombolytics for his STEMI. COVID testing came back negative. He received alteplase, ASA 366m. He had resolution of the ST changes.  Per [facility] report, he had cardiac arrest and received 4 min of CPR and Epi 114m No defibrillation. Per EMR appears to have had it prior to the alteplase.   He was seen by Dr. MaVaughan Browner/15/2020 for COPD and [facility]  Past Medical History  COPD, hypertension, hyperlipidemia, peripheral vascular disease, diverticular disease, sleep apnea (not on CPAP), anxiety   Significant Hospital Events   6/12>> Presented to ED 6/13>> [facility]>  Procedures:  CVC 6/13>  Significant Diagnostic Tests:  CXR 02/20/2019: asymmetric airspace disease in RLL, hyperinflation. No pneumothorax  EKG 02/08/2019: ST elevation in AVR, ST depression diffusely  OSH labs 02/08/2019 WBC 14.4, Hgb 15.4, Plt 295, ANC 9.22 DDimer 6403 PT 11.3, INR 1.1 APTT 23.3 (@2015 ) ABG 7.29/53/406 Na 135, K 4.7, CO2 21, cr 0.9, BUN 15 Troponin 1.32 (@1906 ) NT-proBNP 4680 T bili 1, AST 303, ALT 201, alk phos 107 Procal 0.05 SARS-CoV-2 RNA not detected  Micro Data:  BCx 6/13> Tracheal aspirate 6/13>  Antimicrobials:  Ceftriaxone 6/12> Azithromycin 6/12>  Interim history/subjective:  Bleeding from IJ site overnight, thrombipatch in place Awake and interactive  Objective   Blood pressure (!) 128/59, pulse 63, temperature 97.8 F (36.6 C), temperature source Axillary, resp. rate (!) 24, height 5' 5"  (1.651 m), weight 97.7 kg, SpO2 100 %.    Vent Mode: PRVC FiO2 (%):  [40 %-60 %] 40 % Set Rate:  [20 bmp-24 bmp] 24 bmp Vt Set:  [490 mL] 490 mL PEEP:  [5 cmH20] 5 cmH20 Plateau Pressure:  [25 cmH20] 25 cmH20   Intake/Output Summary (Last 24 hours) at 02/21/2019 0849 Last data filed at 02/24/2019 0600 Gross per 24 hour  Intake 19.6 ml  Output 130 ml  Net -110.4 ml   Filed Weights   02/11/2019 0330 02/17/2019 0355  Weight: 91.9 kg 97.7 kg    Examination: General: Acutely ill appearing male, NAD HENT: [place]/AT, PERRL, EOM-I and MMM, ETT in place  Lungs: Diminished diffusely Cardiovascular: RRR, Nl S1/S2 and -M/R/G Abdomen: Soft, NT, ND and +BS Extremities: Diffuse erythema but no edema or tenderness  Neuro: Of sedation, arousable and following commands GU: Foley in place  Assessment & Plan:  68 year old man 70+ packyear active smoker with history of COPD, hypertension, hyperlipidemia, peripheral vascular disease,  diverticular disease, sleep apnea (not on CPAP), anxiety presented with dyspnea found to have acute hypoxic and hypercarbic respiratory failure, 4 min cardiac arrest, STEMI s/p TPA on 02/08/2019 at 2030 and transferred here from [place].  Acute hypoxic and hypercarbic respiratory failure:  - CXR in AM - ABG in AM - Continue complete vent support given STEMI - Titrate O2 for sat of 90-95% - BD as ordered  CAP, COPD exacerbation: - Brovana, Pulmicort neb BID - Duonebs scheduled and albprn wheezing/dyspnea - Continue ceftriaxone for 5 days and azithromycin 522m for 3 days - Prednisone 438mx 5 days  STEMI/CAD, cardiac arrest: - Cardiology consulted, will be taken to the cath lab today - Heparin gtt, asa, plavix - Hold off on statin for now as he has some transaminitis - TTE with very low EF - Lipid panel, A1c - Cycle trops  Shock, septic versus cardiogenic, transaminitis, hx HTN:  - Obtain venous O2 sat from central line - 86%, no need for dobutamine - Continue levophed gtt for goal MAP >65, down to 4 mcg - Hold home antihypertensives - Check hepatitis panel and acetaminophen level, follow CMP, pending  Anxiety: - Continue home Wellbutrin, citalopram.  - Continue home Klonopin at half dose to prevent withdrawal  - D/C fentanyl, PRN versed  To the cath lab today, hold weaning, will follow  Best practice:  Diet: NPO Pain/Anxiety/Delirium protocol (if indicated): Fent gtt and prn, discontinued versed gtt and can start propofol if needed for goal RASS 0 VAP protocol (if indicated): PPI daily DVT prophylaxis: hep gtt GI prophylaxis: PPI daily Glucose control: Monitor Mobility: OOB when able Code Status: FULL Family Communication: Updated wife and son via phone Disposition: ICU  Labs   CBC: Recent Labs  Lab 01/29/2019 0434 02/12/2019 0523  WBC 18.0*  --   NEUTROABS 16.1*  --   HGB 14.0 14.3  HCT 43.0 42.0  MCV 93.5  --   PLT 270  --     Basic Metabolic Panel: Recent  Labs  Lab 02/16/2019 0434 01/28/2019 0523  NA 134* 135  K 4.3 4.4  CL 100  --   CO2 22  --   GLUCOSE 143*  --   BUN 21  --   CREATININE 1.51*  --   CALCIUM 8.4*  --   MG 2.0  --   PHOS 4.5  --    Recent Labs  Lab 02/18/2019 0434  WBC 18.0*  LATICACIDVEN 1.2   ABG    Component Value Date/Time   HCO3 24.1 02/08/2019 0523   TCO2 26 02/05/2019 0523   ACIDBASEDEF 4.0 (H) 02/03/2019 0523   O2SAT 89.7 02/07/2019 0700   The patient is critically ill with multiple organ systems failure and requires high complexity decision making for assessment and support, frequent evaluation and titration of therapies, application of advanced monitoring technologies and extensive interpretation of multiple databases.   Critical Care Time devoted to patient care services described in this note is  33  Minutes. This time reflects time of care of this signee Dr WeJennet MaduroThis critical care time does not reflect procedure time, or teaching time or supervisory time of PA/NP/Med student/Med  Resident etc but could involve care discussion time.  Rush Farmer, M.D. [facility] Pulmonary/Critical Care Medicine. Pager: [phone]. After hours pager: [phone].

## 2019-02-09 NOTE — Progress Notes (Signed)
[place] for heparin Indication: chest pain/ACS  No Known Allergies  Patient Measurements: Height: 5\' 5"  (165.1 cm) Weight: 215 lb 6.2 oz (97.7 kg) IBW/kg (Calculated) : 61.5 Heparin Dosing Weight: 83.1  Vital Signs: Temp: 98.1 F (36.7 C) (06/13 1543) Temp Source: Oral (06/13 1543) BP: 129/50 (06/13 1700) Pulse Rate: 66 (06/13 1700)  Labs: Recent Labs    03-08-19 0434 03/08/19 0523 03-08-2019 1639  HGB 14.0 14.3  --   HCT 43.0 42.0  --   PLT 270  --   --   APTT 54*  --   --   LABPROT 16.2*  --   --   INR 1.3*  --   --   HEPARINUNFRC 0.18*  --   --   CREATININE 1.51*  --   --   TROPONINI 42.10*  --  54.52*    Estimated Creatinine Clearance: 49.6 mL/min (A) (by C-G formula based on SCr of 1.51 mg/dL (H)).   Medical History: Past Medical History:  Diagnosis Date  . Anxiety   . COPD (chronic obstructive pulmonary disease) ([place])   . Hypertension   . OSA (obstructive sleep apnea)      Assessment: 36 yoM transferred from [place] with STEMI s/p tPA. Pt started on heparin and then taken to the cath lab today revealing 3-vessel CAD. Heparin restarted 6hr after sheath pull. TR site oozing but stable, central line site oozing as well, no other S/Sx bleeding.  Goal of Therapy:  Heparin level 0.3-0.5 units/ml units/ml Monitor platelets by anticoagulation protocol: Yes   Plan:  Restart heparin drip at 1000 units/hr at 1700 Watch S/Sx bleeding closely Check 6hr heparin level   Arrie Senate, PharmD, BCPS Clinical Pharmacist [phone] Please check AMION for all [place] numbers March 08, 2019

## 2019-02-09 NOTE — Progress Notes (Signed)
ANTICOAGULATION CONSULT NOTE - Initial Consult  Pharmacy Consult for heparin Indication: chest pain/ACS  No Known Allergies  Patient Measurements: Height: 5\' 5"  (165.1 cm) Weight: 215 lb 6.2 oz (97.7 kg) IBW/kg (Calculated) : 61.5 Heparin Dosing Weight: 83.1  Vital Signs: BP: 105/50 (06/13 0400) Pulse Rate: 57 (06/13 0400)  Labs: No results for input(s): HGB, HCT, PLT, APTT, LABPROT, INR, HEPARINUNFRC, HEPRLOWMOCWT, CREATININE, CKTOTAL, CKMB, TROPONINI in the last 72 hours.  CrCl cannot be calculated (No successful lab value found.).   Medical History: No past medical history on file.  Medications:  See medication history  Assessment: 68 yo man transferred from [place] for CP.  He received TPA 100 mg, heparin 4000 unit bolus and heparin drip at 1000 units/hr.  Pharmacy asked to continue heparin infusion. Baseline labs were wnl. Will aim for lower goal first 24 hours of therapy. Goal of Therapy:  Heparin level 0.3-0.5 units/ml units/ml Monitor platelets by anticoagulation protocol: Yes   Plan:  Continue heparin drip at 1000 units/hr Check heparin level and aPTT. Daily heparin level and CBC while on heparin Monitor for bleeding complications  Thanks for allowing pharmacy to be a part of this patient's care.  Excell Seltzer, PharmD Clinical Pharmacist  Feb 23, 2019,4:21 AM

## 2019-02-09 NOTE — Progress Notes (Signed)
Sputum culture collected and sent to the lab. 

## 2019-02-09 NOTE — Progress Notes (Signed)
Assisted tele visit to patient with son.  Rayshun Kandler Ann, RN  

## 2019-02-09 NOTE — Progress Notes (Signed)
Initial Nutrition Assessment  DOCUMENTATION CODES:   Not applicable  INTERVENTION:   If unable to extubate in 24-48 hrs   Tube feeding:  -Vital AF 1.2 @ 50 ml/hr via OGT (1200 ml) -30 ml Prostat BID  Provides: 1640 kcals, 120 grams protein, 973 ml free water. Meets 102% kcal needs and 100% of protein needs.   NUTRITION DIAGNOSIS:   Inadequate oral intake related to inability to eat as evidenced by NPO status.  GOAL:   Provide needs based on ASPEN/SCCM guidelines  MONITOR:   Diet advancement, Vent status, Skin, Weight trends, TF tolerance, Labs, I & O's  REASON FOR ASSESSMENT:   Ventilator    ASSESSMENT:   Patient with PMH significant for COPD, HTN, HLD, PVD, diverticular disease, and anxiety. Presents this admission from [place] with STEMI. Upon transfer had cardiac arrest s/p TPA.   Pt for cath today.Alert and responsive on vent. Weaning held until after procedure. Requiring low dose levophed. Propofol on hold.   Admission weight: 91.9 kg (will use to estimate needs)   Current weight: 97.7 kg   Patient is currently intubated on ventilator support MV: 6.4  L/min Temp (24hrs), Avg:98.4 F (36.9 C), Min:97.8 F (36.6 C), Max:99 F (37.2 C)  I/O: -110 ml since admit UOP: 130 ml x 24 hrs   Drips: levophed  Medications: 40 mg lasix BID, prednisone Labs: elevated LFTs CBG 143   Diet Order:   Diet Order            Diet NPO time specified  Diet effective now              EDUCATION NEEDS:   Not appropriate for education at this time  Skin:  Skin Assessment: Skin Integrity Issues: Skin Integrity Issues:: Other (Comment) Other: skin tear-left arm  Last BM:  PTA  Height:   Ht Readings from Last 1 Encounters:  02-19-19 5\' 5"  (1.651 m)    Weight:   Wt Readings from Last 1 Encounters:  19-Feb-2019 97.7 kg    Ideal Body Weight:  61.8 kg  BMI:  Body mass index is 35.84 kg/m.  Estimated Nutritional Needs:   Kcal:  1605 kcal  Protein:   120-135 grams  Fluid:  >/= 1.6 L/day   Mariana Single RD, LDN Clinical Nutrition Pager # - [phone]

## 2019-02-09 NOTE — Progress Notes (Signed)
Critical Troponin of 42.10 received from [place] in lab. Dr. Jimmy Footman CCM called via phone and made aware.

## 2019-02-09 NOTE — Progress Notes (Addendum)
Patient ID: Brent Carter, male   DOB: [birth year], 68 y.o.   MRN: [id number]     Advanced Heart Failure Rounding Note  PCP-Cardiologist: No primary care provider on file.   Subjective:    Patient is awake on vent this morning.  Stable on NE 4 with SBP now up in the 130s-140s.  Co-ox 89%, CVP 12-13.   ECG from [name] per report showed STE AVR and diffuse ST depression. Patient received tPA at [facility], this morning ECG showed no STE in AVR but shows evidence for anteroseptal MI.    Objective:   Weight Range: 97.7 kg Body mass index is 35.84 kg/m.   Vital Signs:   Temp:  [97.8 F (36.6 C)-99 F (37.2 C)] 97.8 F (36.6 C) (06/13 0724) Pulse Rate:  [53-83] 66 (06/13 0900) Resp:  [10-51] 10 (06/13 0900) BP: (105-143)/(43-76) 143/76 (06/13 0900) SpO2:  [98 %-100 %] 99 % (06/13 0900) FiO2 (%):  [40 %-60 %] 40 % (06/13 0851) Weight:  [91.9 kg-97.7 kg] 97.7 kg (06/13 0355) Last BM Date: (PTA)  Weight change: Filed Weights   02/21/2019 0330 02/05/2019 0355  Weight: 91.9 kg 97.7 kg    Intake/Output:   Intake/Output Summary (Last 24 hours) at 02/24/2019 0925 Last data filed at 02/23/2019 0600 Gross per 24 hour  Intake 19.6 ml  Output 130 ml  Net -110.4 ml      Physical Exam    General:  Intubated, awake.  HEENT: Normal Neck: Supple. JVP 9-10 cm. Carotids 2+ bilat; no bruits. No lymphadenopathy or thyromegaly appreciated. Cor: PMI nondisplaced. Regular rate & rhythm. No rubs, gallops or murmurs. Lungs: Decreased BS at bases.  Abdomen: Soft, nontender, nondistended. No hepatosplenomegaly. No bruits or masses. Good bowel sounds. Extremities: No cyanosis, clubbing, rash, edema Neuro: Alert, follows commands.    Telemetry   NSR in 60s (personally reviewed)  EKG    NSR, anteroseptal MI (Qs, slight STE), slight STE AVL  Labs    CBC Recent Labs    02/05/2019 0434 02/21/2019 0523  WBC 18.0*  --   NEUTROABS 16.1*  --   HGB 14.0 14.3  HCT 43.0 42.0  MCV 93.5  --     PLT 270  --    Basic Metabolic Panel Recent Labs    02/26/2019 0434 02/16/2019 0523  NA 134* 135  K 4.3 4.4  CL 100  --   CO2 22  --   GLUCOSE 143*  --   BUN 21  --   CREATININE 1.51*  --   CALCIUM 8.4*  --   MG 2.0  --   PHOS 4.5  --    Liver Function Tests Recent Labs    01/29/2019 0434  AST 204*  ALT 186*  ALKPHOS 80  BILITOT 1.0  PROT 5.7*  ALBUMIN 2.9*   No results for input(s): LIPASE, AMYLASE in the last 72 hours. Cardiac Enzymes Recent Labs    02/23/2019 0434  TROPONINI 42.10*    BNP: BNP (last 3 results) Recent Labs    02/19/2019 0434  BNP 1,279.0*    ProBNP (last 3 results) No results for input(s): PROBNP in the last 8760 hours.   D-Dimer No results for input(s): DDIMER in the last 72 hours. Hemoglobin A1C Recent Labs    02/05/2019 0638  HGBA1C 5.6   Fasting Lipid Panel Recent Labs    02/17/2019 0638  CHOL 115  HDL 36*  LDLCALC 71  TRIG 42  CHOLHDL 3.2   Thyroid Function Tests  No results for input(s): TSH, T4TOTAL, T3FREE, THYROIDAB in the last 72 hours.  Invalid input(s): FREET3  Other results:   Imaging    Dg Chest Port 1 View  Result Date: 02/22/2019 CLINICAL DATA:  STEMI. Acute respiratory failure. Central line and endotracheal tube placement. EXAM: PORTABLE CHEST 1 VIEW COMPARISON:  02/08/2019 FINDINGS: Endotracheal tube remains high in position, with tip at level of clavicles. New nasogastric tube is seen entering the stomach. New right internal jugular central venous catheter is seen with tip overlying the distal SVC. No evidence of pneumothorax. Heart size is stable. Mild worsening of asymmetric airspace disease is seen in both lung bases, right side greater than left. IMPRESSION: 1. New right internal jugular central venous catheter and nasogastric tube in appropriate position. No evidence of pneumothorax. 2. High endotracheal tube position, with tip at level of clavicles. 3. Mild worsening of asymmetric airspace disease in both  lung bases, right side greater than left. Electronically Signed   By: Myles RosenthalJohn  Stahl M.D.   On: 02/13/2019 07:41      Medications:     Scheduled Medications:  arformoterol  15 mcg Nebulization BID   aspirin  81 mg Per Tube Daily   atorvastatin  80 mg Oral q1800   budesonide (PULMICORT) nebulizer solution  0.25 mg Nebulization BID   buPROPion  50 mg Per Tube TID   citalopram  20 mg Per Tube Daily   clonazePAM  0.25 mg Per Tube Daily   clopidogrel  75 mg Per Tube Daily   ipratropium-albuterol  3 mL Nebulization Q6H   pantoprazole sodium  40 mg Per Tube Daily   predniSONE  40 mg Per Tube Daily     Infusions:  azithromycin     cefTRIAXone (ROCEPHIN)  IV     fentaNYL infusion INTRAVENOUS 50 mcg/hr (02/13/2019 0600)   heparin 1,000 Units/hr (01/29/2019 0600)   norepinephrine (LEVOPHED) Adult infusion 4 mcg/min (02/24/2019 0849)   propofol (DIPRIVAN) infusion Stopped (02/01/2019 0450)     PRN Medications:  acetaminophen, albuterol, bisacodyl, docusate, fentaNYL   Assessment/Plan   1. CAD: Anterior STEMI.  [facility].  There was concern for coronavirus and tPA was given while waiting for COVID-19 testing.  This was negative.  This morning, ECG with ASMI with slight STE.   - Patient had tPA last night at 8:30 pm.  I discussed with Dr. Tresa EndoKelly, plan for coronary angiography/potential revascularization today.  - Continue heparin gtt, statin, ASA 81, Plavix 75.  2. Acute systolic CHF: Ischemic cardiomyopathy.  EF low with evidence for anterior MI on bedside echo last night.  CVP 12-13.  He remains on NE 4 with good co-ox.  BP stable now.  - Lasix 40 mg IV bid today.  - Should be able to wean off norepinephrine.  3. Acute hypoxemic respiratory failure:  PNA/COPD exacerbation. Suspect there is a component of pulmonary edema as well.  CXR with infiltrates on CXR (R>L base).  Afebrile but WBCs 18.  - Will cover with ceftriaxone/doxycycline (would avoid  azithromycin).  - He is on prednisone per CCM.  - Diurese as above.  - vent per CCM.  4. COPD: Treating for AECOPD per CCM with abx, prednisone, nebs.  5. ID: Covering for PNA as above.   6. ?AKI: Creatinine 1.5, unsure of baseline. May have in setting of hypotension/cardiac arrest.  7. Cardiac arrest: Prior to tPA, uncertain rhythm, had 4 minutes CPR + epinephrine, no shock. No apparent neurologic damage (alert, follows  commands on vent).  8. Elevated LFTs: Likely related to hypoperfusion with arrest.   CRITICAL CARE Performed by: Marca Anconaalton Cadience Bradfield  Total critical care time: 45 minutes  Critical care time was exclusive of separately billable procedures and treating other patients.  Critical care was necessary to treat or prevent imminent or life-threatening deterioration.  Critical care was time spent personally by me on the following activities: development of treatment plan with patient and/or surrogate as well as nursing, discussions with consultants, evaluation of patient's response to treatment, examination of patient, obtaining history from patient or surrogate, ordering and performing treatments and interventions, ordering and review of laboratory studies, ordering and review of radiographic studies, pulse oximetry and re-evaluation of patient's condition.   Length of Stay: 0  Marca Anconaalton Fabien Travelstead, MD  Jun 09, 2019, 9:25 AM  Advanced Heart Failure Team Pager [phone] (M-F; 7a - 4p)  Please contact [facility] for night-coverage after hours (4p -7a ) and weekends on [email or web address]  See cath report from today.  Severe 3VD.  I spoke with Dr. Tyrone SageGerhardt who will see patient for TCTS.   Marca AnconaDalton Tabari Volkert Jun 09, 2019

## 2019-02-09 NOTE — Consult Note (Signed)
[phone]       [place] [address]             [phone]        Brent Carter [place] Medical Record #[id number] Date of Birth: [birth year]  Referring:Dr McClean Primary Care: Rochel Brome, MD Primary Cardiologist:No primary care provider on file.  Chief Complaint:  Acute respiratory failure acute myocardial infarction with cardiac arrest  History of Present Illness:     Patient is a 68 year old male who has known underlying severe COPD. ,  With history of use of home oxygen as needed at night recently evaluated by the pulmonary service.  Patient then acutely presented 2 days ago in acute respiratory distress to [facility], initially thought to be covid infection , had a cardiac arrest requiring CPR positive troponin, was treated with TPA for acute myocardial infarction and ultimately transferred to [facility].  He underwent cardiac catheterization that demonstrated severe three-vessel disease, LV was not assessed at the time of cath.  Patient was extubated this morning, but continued respiratory failure has required reintubation  Patient is a long-term smoker   current Activity/ Functional Status: Patient was independent with mobility/ambulation, transfers, ADL's, IADL's.   Zubrod Score: At the time of surgery this patient's most appropriate activity status/level should be described as: []     0    Normal activity, no symptoms [x]     1    Restricted in physical strenuous activity but ambulatory, able to do out light work []     2    Ambulatory and capable of self care, unable to do work activities, up and about                 more than 50%  Of the time                            []     3    Only limited self care, in bed greater than 50% of waking hours []     4    Completely disabled, no self care, confined to bed or chair []     5    Moribund  Past Medical History:  Diagnosis Date  . Anxiety   . COPD (chronic obstructive pulmonary disease)  ([place])   . Hypertension   . OSA (obstructive sleep apnea)     History reviewed. No pertinent surgical history.  Social History   Tobacco Use  Smoking Status Current Every Day Smoker  . Packs/day: 0.50  . Years: 56.00  . Pack years: 28.00  . Types: Cigarettes  Smokeless Tobacco Never Used  Tobacco Comment   started at 68 yrs old    Social History   Substance and Sexual Activity  Alcohol Use None     No Known Allergies  Current Facility-Administered Medications  Medication Dose Route Frequency Provider Last Rate Last Dose  . 0.9 %  sodium chloride infusion   Intravenous Continuous Troy Sine, MD   Stopped at 02/10/19 [phone]  . 0.9 %  sodium chloride infusion  250 mL Intravenous PRN Troy Sine, MD   Stopped at 02/10/19 0520  . acetaminophen (TYLENOL) tablet 650 mg  650 mg Oral Q4H PRN Troy Sine, MD      . albuterol (PROVENTIL) (2.5 MG/3ML) 0.083% nebulizer solution 2.5 mg  2.5 mg Nebulization Q4H PRN Milagros Loll, MD      . arformoterol (  BROVANA) nebulizer solution 15 mcg  15 mcg Nebulization BID Milagros Loll, MD   15 mcg at 02/10/19 0744  . aspirin chewable tablet 81 mg  81 mg Oral Daily Troy Sine, MD   81 mg at 02/10/19 1314  . atorvastatin (LIPITOR) tablet 80 mg  80 mg Oral q1800 Troy Sine, MD   80 mg at 02/06/2019 1723  . bisacodyl (DULCOLAX) suppository 10 mg  10 mg Rectal Daily PRN Jacques Earthly T, MD      . budesonide (PULMICORT) nebulizer solution 0.25 mg  0.25 mg Nebulization BID Jacques Earthly T, MD   0.25 mg at 02/10/19 0744  . buPROPion (WELLBUTRIN XL) 24 hr tablet 150 mg  150 mg Oral Daily Rush Farmer, MD   150 mg at 02/10/19 1112  . cefTRIAXone (ROCEPHIN) 1 g in sodium chloride 0.9 % 100 mL IVPB  1 g Intravenous Q24H Milagros Loll, MD   Stopped at 02/07/2019 2055  . chlorhexidine gluconate (MEDLINE KIT) (PERIDEX) 0.12 % solution 15 mL  15 mL Mouth Rinse BID Milagros Loll, MD   15 mL at 02/10/19 0749  . citalopram  (CELEXA) tablet 20 mg  20 mg Oral Daily Rush Farmer, MD   20 mg at 02/10/19 1112  . clonazePAM (KLONOPIN) disintegrating tablet 0.25 mg  0.25 mg Per Tube Daily Milagros Loll, MD   0.25 mg at 02/10/19 1112  . dexmedetomidine (PRECEDEX) 200 MCG/50ML (4 mcg/mL) infusion  0-1.2 mcg/kg/hr Intravenous Continuous Rush Farmer, MD      . digoxin (LANOXIN) tablet 0.125 mg  0.125 mg Oral Daily Bensimhon, Shaune Pascal, MD   0.125 mg at 02/10/19 1314  . docusate (COLACE) 50 MG/5ML liquid 100 mg  100 mg Per Tube BID PRN Jacques Earthly T, MD      . doxycycline (VIBRAMYCIN) 100 mg in sodium chloride 0.9 % 250 mL IVPB  100 mg Intravenous Q12H Larey Dresser, MD 125 mL/hr at 02/10/19 0924 100 mg at 02/10/19 0924  . fentaNYL (SUBLIMAZE) 100 MCG/2ML injection           . fentaNYL (SUBLIMAZE) bolus via infusion 25 mcg  25 mcg Intravenous Q15 min PRN Rush Farmer, MD      . fentaNYL (SUBLIMAZE) injection 25 mcg  25 mcg Intravenous Once Rush Farmer, MD      . fentaNYL 2569mg in NS 2595m(1063mml) infusion-PREMIX  25-200 mcg/hr Intravenous Continuous YacRush FarmerD      . furosemide (LASIX) injection 40 mg  40 mg Intravenous Q6H YacRush FarmerD   40 mg at 02/10/19 1559  . heparin ADULT infusion 100 units/mL (25000 units/250m55mdium chloride 0.45%)  1,400 Units/hr Intravenous Continuous Bensimhon, DaniShaune Pascal 14 mL/hr at 02/10/19 1519 1,400 Units/hr at 02/10/19 1519  . ipratropium-albuterol (DUONEB) 0.5-2.5 (3) MG/3ML nebulizer solution 3 mL  3 mL Nebulization Q6H KralMilagros Loll   3 mL at 02/10/19 1525  . midazolam (VERSED) injection 1-2 mg  1-2 mg Intravenous Q2H PRN Deterding, ElizGuadelupe Sabin      . norepinephrine (LEVOPHED) 16 mg in 250mL55mmix infusion  0-40 mcg/min Intravenous Titrated KrallMilagros Loll7.5 mL/hr at 01/31/2019 1825 8 mcg/min at 02/23/2019 1825  . ondansetron (ZOFRAN) injection 4 mg  4 mg Intravenous Q6H PRN KellyTroy Sine     . predniSONE (DELTASONE) tablet  40 mg  40 mg Oral Daily YacouRush Farmer  40 mg at 02/10/19 1315  . sodium chloride flush (NS) 0.9 % injection 3 mL  3 mL Intravenous Q12H [name] A, MD      . sodium chloride flush (NS) 0.9 % injection 3 mL  3 mL Intravenous PRN Troy Sine, MD      . spironolactone (ALDACTONE) tablet 12.5 mg  12.5 mg Oral Daily Bensimhon, Shaune Pascal, MD   12.5 mg at 02/10/19 1314    Medications Prior to Admission  Medication Sig Dispense Refill Last Dose  . acetaminophen (TYLENOL) 500 MG tablet Take 1,000 mg by mouth every 6 (six) hours as needed for mild pain or headache.   unk  . albuterol (PROAIR HFA) 108 (90 Base) MCG/ACT inhaler Inhale 1-2 puffs into the lungs every 6 (six) hours as needed for wheezing or shortness of breath.    unk  . citalopram (CELEXA) 20 MG tablet Take 20 mg by mouth daily.   02/08/2019 at Unknown time  . clonazePAM (KLONOPIN) 0.5 MG tablet Take 0.5 mg by mouth daily as needed for anxiety.   Past Week at Unknown time  . clopidogrel (PLAVIX) 75 MG tablet Take 75 mg by mouth daily.   02/08/2019 at 01000  . Fluticasone-Umeclidin-Vilant (TRELEGY ELLIPTA) 100-62.5-25 MCG/INH AEPB Inhale 1 puff into the lungs daily. 1 each 0 02/08/2019 at Unknown time  . furosemide (LASIX) 20 MG tablet Take 20 mg by mouth daily as needed for edema.   02/08/2019 at Unknown time  . losartan (COZAAR) 100 MG tablet Take 100 mg by mouth daily.   02/08/2019 at Unknown time  . Omega-3 Fatty Acids (FISH OIL) 1000 MG CAPS Take 1,000 mg by mouth 2 (two) times daily.    02/08/2019 at Unknown time  . simethicone (MYLICON) 962 MG chewable tablet Chew 125 mg by mouth every 6 (six) hours as needed for flatulence.   unk  . buPROPion (WELLBUTRIN XL) 150 MG 24 hr tablet Take 150 mg by mouth daily.   Not Taking at Unknown time  . Fluticasone-Umeclidin-Vilant (TRELEGY ELLIPTA) 100-62.5-25 MCG/INH AEPB Inhale 1 puff into the lungs daily. (Patient not taking: Reported on 02/22/2019) 60 each 5 Not Taking at Unknown time  .  losartan-hydrochlorothiazide (HYZAAR) 100-25 MG tablet Take 1 tablet by mouth daily.   Not Taking at Unknown time  . mometasone-formoterol (DULERA) 200-5 MCG/ACT AERO Inhale 2 puffs into the lungs 2 (two) times daily.   Not Taking at Unknown time  . Nicotine 21-14-7 MG/24HR KIT Take as directed (Patient not taking: Reported on 02/21/2019) 56 each 0 Not Taking at Unknown time  . predniSONE (DELTASONE) 10 MG tablet 4 tabs x's 3 days,3tabs x's 3days,2tabs x's 3 days,1 tab x's 3 days,then stop (Patient not taking: Reported on 01/28/2019) 30 tablet 0 Not Taking at Unknown time    Family History  Problem Relation Age of Onset  . Lung disease Neg Hx      Review of Systems:   ROS Pertinent items are noted in HPI.  Patient sedated on vent unable to obtain ros     Cardiac Review of Systems: Y or  [    ]= no  Chest Pain [ y   ]  Resting SOB [ y  ] Exertional SOB  Blue.Reese  ]  Orthopnea Blue.Reese  ]   Pedal Edema Blue.Reese   ]    Palpitations n] Syncope  [ n ]   Presyncope [  n ]  General Review of Systems: [Y] = yes [  ]=  no Constitional: recent weight change [  ]; anorexia [  ]; fatigue [  ]; nausea [  ]; night sweats [  ]; fever [  ]; or chills [  ]                                                               Dental: Last Dentist visit:   Eye : blurred vision [  ]; diplopia [   ]; vision changes [  ];  Amaurosis fugax[  ]; Resp: cough [  ];  wheezing[  ];  hemoptysis[  ]; shortness of breath[  ]; paroxysmal nocturnal dyspnea[  ]; dyspnea on exertion[  ]; or orthopnea[  ];  GI:  gallstones[  ], vomiting[  ];  dysphagia[  ]; melena[  ];  hematochezia [  ]; heartburn[  ];   Hx of  Colonoscopy[  ]; GU: kidney stones [  ]; hematuria[  ];   dysuria [  ];  nocturia[  ];  history of     obstruction [  ]; urinary frequency [  ]             Skin: rash, swelling[  ];, hair loss[  ];  peripheral edema[  ];  or itching[  ]; Musculosketetal: myalgias[  ];  joint swelling[  ];  joint erythema[  ];  joint pain[  ];  back pain[  ];   Heme/Lymph: bruising[  ];  bleeding[  ];  anemia[  ];  Neuro: TIA[  ];  headaches[  ];  stroke[  ];  vertigo[  ];  seizures[  ];   paresthesias[  ];  difficulty walking[  ];  Psych:depression[  ]; anxiety[  ];  Endocrine: diabetes[  ];  thyroid dysfunction[  ];               Physical Exam: BP 114/71 (BP Location: Left Arm)   Pulse (!) 103   Temp (!) 97.5 F (36.4 C) (Axillary)   Resp 18   Ht 5' 5"  (1.651 m)   Wt 95.5 kg   SpO2 96%   BMI 35.04 kg/m    General appearance: cooperative and moderate distress Head: Normocephalic, without obvious abnormality, atraumatic Neck: no adenopathy, no carotid bruit, no JVD, supple, symmetrical, trachea midline and thyroid not enlarged, symmetric, no tenderness/mass/nodules Lymph nodes: Cervical, supraclavicular, and axillary nodes normal. Resp: diminished breath sounds bilaterally Back: symmetric, no curvature. ROM normal. No CVA tenderness. Cardio: regular rate and rhythm, S1, S2 normal, no murmur, click, rub or gallop GI: Multiple abdominal scars from previous surgery Extremities: When patient first examined he he had IVs in the saphenous veins in both lower extremities, both upper extremities were severely bruised and ecchymotic, but this appeared chronic and not because of recent anticoagulation of the patient Neurologic: Grossly normal-but hard to fully assess as the patient has been intubated  Diagnostic Studies & Laboratory data:     Recent Radiology Findings:   Dg Chest Port 1 View  Result Date: 02/10/2019 CLINICAL DATA:  Intubation EXAM: PORTABLE CHEST 1 VIEW COMPARISON:  01/30/2019 FINDINGS: Endotracheal tube 9.2 cm above the carina. Right IJ central line tip upper SVC level. NG tube enters the stomach. No enlarging effusion or pneumothorax. Improvement in the asymmetric predominately interstitial opacities throughout the  mid and lower lungs, worse on the right with peripheral Kerley B-lines. Suspect improvement in the pulmonary edema  pattern. Difficult to exclude basilar pneumonia. Aorta atherosclerotic. IMPRESSION: Slight improvement in pulmonary edema pattern. Stable support apparatus Electronically Signed   By: Jerilynn Mages.  Shick M.D.   On: 02/10/2019 08:59   Dg Chest Port 1 View  Result Date: 02/18/2019 CLINICAL DATA:  STEMI. Acute respiratory failure. Central line and endotracheal tube placement. EXAM: PORTABLE CHEST 1 VIEW COMPARISON:  02/08/2019 FINDINGS: Endotracheal tube remains high in position, with tip at level of clavicles. New nasogastric tube is seen entering the stomach. New right internal jugular central venous catheter is seen with tip overlying the distal SVC. No evidence of pneumothorax. Heart size is stable. Mild worsening of asymmetric airspace disease is seen in both lung bases, right side greater than left. IMPRESSION: 1. New right internal jugular central venous catheter and nasogastric tube in appropriate position. No evidence of pneumothorax. 2. High endotracheal tube position, with tip at level of clavicles. 3. Mild worsening of asymmetric airspace disease in both lung bases, right side greater than left. Electronically Signed   By: Earle Gell M.D.   On: 02/05/2019 07:41     Final Report  CLINICAL DATA: 68 year old asymptomatic male current smoker with 30 pack-year smoking history.  EXAM: CT CHEST WITHOUT CONTRAST LOW-DOSE FOR LUNG CANCER SCREENING  TECHNIQUE: Multidetector CT imaging of the chest was performed following the standard protocol without IV contrast.  COMPARISON: 02/21/2013 chest radiograph.  FINDINGS: Cardiovascular: Normal heart size. No significant pericardial effusion/thickening. Three-vessel coronary atherosclerosis. Atherosclerotic nonaneurysmal thoracic aorta. Normal caliber pulmonary arteries.  Mediastinum/Nodes: No discrete thyroid nodules. Unremarkable esophagus. No pathologically enlarged axillary, mediastinal or hilar lymph nodes, noting limited sensitivity for the  detection of hilar adenopathy on this noncontrast study.  Lungs/Pleura: No pneumothorax. No pleural effusion. Severe centrilobular emphysema with diffuse bronchial wall thickening. No acute consolidative airspace disease or lung masses. A few scattered solid pulmonary nodules scattered in both lungs, largest 5.6 mm in volume derived mean diameter in the basilar right lower lobe (series 4/image 209).  Upper abdomen: Cholecystectomy. Diffuse hepatic steatosis. Partially visualized high ventral abdominal hernia mesh repair.  Musculoskeletal: No aggressive appearing focal osseous lesions. Partially visualized surgical hardware from ACDF in the lower cervical spine. Moderate thoracic spondylosis.  IMPRESSION: 1. Lung-RADS 2, benign appearance or behavior. Continue annual screening with low-dose chest CT without contrast in 12 months. 2. Three-vessel coronary atherosclerosis.  Aortic Atherosclerosis (ICD10-I70.0) and Emphysema (ICD10-J43.9).   Electronically Signed By: Ilona Sorrel M.D.   I have independently reviewed the above radiologic studies  Recent Lab Findings: Lab Results  Component Value Date   WBC 16.1 (H) 02/10/2019   HGB 12.6 (L) 02/10/2019   HCT 37.0 (L) 02/10/2019   PLT 239 02/10/2019   GLUCOSE 139 (H) 02/10/2019   CHOL 115 02/11/2019   TRIG 87 02/10/2019   HDL 36 (L) 02/22/2019   LDLCALC 71 02/25/2019   ALT 115 (H) 02/10/2019   AST 99 (H) 02/10/2019   NA 136 02/10/2019   K 4.6 02/10/2019   CL 101 02/10/2019   CREATININE 1.07 02/10/2019   BUN 21 02/10/2019   CO2 24 02/10/2019   INR 1.3 (H) 02/17/2019   HGBA1C 5.6 02/13/2019   Lab Results  Component Value Date   TROPONINI 29.52 ([place]) 02/06/2019  On admission troponin was 45 Baseline cr unknown   Chronic Kidney Disease   Stage I     GFR >90  Stage II  GFR 60-89  Stage IIIA GFR 45-59  Stage IIIB GFR 30-44  Stage IV   GFR 15-29  Stage V    GFR  <15  Lab Results  Component Value Date    CREATININE 1.07 02/10/2019   Estimated Creatinine Clearance: 69.2 mL/min (by C-G formula based on SCr of 1.07 mg/dL).  Ost RCA lesion is 85% stenosed.  Prox RCA-1 lesion is 90% stenosed.  Prox RCA-2 lesion is 80% stenosed.  Mid RCA lesion is 50% stenosed.  Mid LM to Dist LM lesion is 50% stenosed.  Dist LM to Prox LAD lesion is 90% stenosed.  Prox Cx to Mid Cx lesion is 80% stenosed.  2nd Diag lesion is 95% stenosed.  Ost Cx to Prox Cx lesion is 90% stenosed.   Severe multivessel coronary obstructive disease with distal left main taper of 50%; 90% ostial LAD stenosis; 90% proximal circumflex stenosis followed by 80% mid stenosis; and 85% ostial RCA stenosis followed by 4403 and 50% proximal to mid stenoses.  LVEDP 15 mm HG.  S/P cardiorespiratory arrest upon presentation to [facility] last evening with initial significant ST depression of global ischemia with 1 mm ST elevation in aVR suggestive of left main disease.  Following TPA infusion he had successful revascularization such that now there was TIMI-3 flow in all vessels.  RECOMMENDATION: Surgical consultation will be obtained for CABG revascularization surgery.    ECHOCARDIOGRAM REPORT       Patient Name:   Brent Carter Date of Exam: 02/19/2019 Medical Rec #:  [id number]       Height:       65.0 in Accession #:    [id number]      Weight:       215.4 lb Date of Birth:  November 07, 1950       BSA:          2.04 m Patient Age:    41 years        BP:           128/59 mmHg Patient Gender: M               HR:           63 bpm. Exam Location:  Inpatient    Procedure: 2D Echo, Cardiac Doppler and Color Doppler  Indications:    Abnormal ecg 794.31 / R94.31   History:        Patient has no prior history of Echocardiogram examinations.                 STEMI, COPD,.   Sonographer:    Madelaine Etienne RDCS (AE) Referring Phys: [id number] Milagros Loll  IMPRESSIONs    1.  2. The left ventricle was not well  visualized. The cavity size was normal. Left ventricular diastolic Doppler parameters are indeterminate. Left ventricular diffuse hypokinesis.  3. LV and endocardium are poorly visualized. With contrast the LVEF appears to be decreased, approximately 30-35% with global hypokinesis, cannot distinguish if some wall segements are worst than others.  4. RV very poor visualization. Grossly appears normal in size and function.  5. Left atrial size was not well visualized.  6. Poorly visualized, grossly appears enlarged.  7. The mitral valve was not well visualized. No evidence of mitral valve stenosis.  8. The aortic valve was not well visualized. No stenosis of the aortic valve.  9. The inferior vena cava was dilated in size with <50% respiratory variability. 10. The interatrial septum was not well  visualized.  FINDINGS  Left Ventricle: The left ventricle was not well visualized. The cavity size was normal. There is no increase in left ventricular wall thickness. Left ventricular diastolic Doppler parameters are indeterminate. Left ventricular diffuse hypokinesis. LV  and endocardium are poorly visualized. With contrast the LVEF appears to be decreased, approximately 30-35% with global hypokinesis, cannot distinguish if some wall segements are worst than others.  LV Wall Scoring: There is diffuse hypokinesis.   Right Ventricle: The right ventricle was not well visualized. The cavity was grossly normal in size. There is no increase in right ventricular wall thickness. Very poor visualization. Grossly appaers normal in size and function.  Left Atrium: Left atrial size was not well visualized. Poorly visualized, grossly appears enlarged.  Right Atrium: Right atrial size was not well visualized. Right atrial pressure is estimated at 10 mmHg.  Interatrial Septum: The interatrial septum was not well visualized.  Pericardium: The pericardium was not well visualized.  Mitral Valve: The mitral  valve was not well visualized. Mitral valve regurgitation is trivial by color flow Doppler. No evidence of mitral valve stenosis.  Tricuspid Valve: The tricuspid valve is not well visualized. Tricuspid valve regurgitation was not visualized by color flow Doppler.  Aortic Valve: The aortic valve was not well visualized Aortic valve regurgitation was not visualized by color flow Doppler. There is No stenosis of the aortic valve, with a calculated valve area of 2.75 cm.  Pulmonic Valve: The pulmonic valve was not well visualized. Pulmonic valve regurgitation was not assessed by color flow Doppler.  Venous: The inferior vena cava is dilated in size with less than 50% respiratory variability.    +--------------+--------++ LEFT VENTRICLE              +----------------+---------++ +--------------+--------++      Diastology                PLAX 2D                     +----------------+---------++ +--------------+--------++      LV e' lateral:  6.42 cm/s LVIDd:        5.20 cm       +----------------+---------++ +--------------+--------++      LV E/e' lateral:9.2       LVIDs:        3.30 cm       +----------------+---------++ +--------------+--------++      LV e' medial:   3.26 cm/s LV PW:        0.80 cm       +----------------+---------++ +--------------+--------++      LV E/e' medial: 18.1      LV IVS:       1.00 cm       +----------------+---------++ +--------------+--------++ LVOT diam:    2.20 cm  +--------------+--------++ LV SV:        85 ml    +--------------+--------++ LV SV Index:  39.54    +--------------+--------++ LVOT Area:    3.80 cm +--------------+--------++                        +--------------+--------++   +------------------+---------++ LV Volumes (MOD)            +------------------+---------++ LV area d, A2C:   48.90 cm +------------------+---------++ LV area d, A4C:   48.50 cm  +------------------+---------++ LV area s, A2C:   37.10 cm +------------------+---------++ LV area s, A4C:   38.90 cm +------------------+---------++ LV major d, A2C:  8.22 cm   +------------------+---------++  LV major d, A4C:  8.96 cm   +------------------+---------++ LV major s, A2C:  7.59 cm   +------------------+---------++ LV major s, A4C:  8.37 cm   +------------------+---------++ LV vol d, MOD A2C:244.0 ml  +------------------+---------++ LV vol d, MOD A4C:213.0 ml  +------------------+---------++ LV vol s, MOD A2C:150.0 ml  +------------------+---------++ LV vol s, MOD A4C:146.0 ml  +------------------+---------++ LV SV MOD A2C:    94.0 ml   +------------------+---------++ LV SV MOD A4C:    213.0 ml  +------------------+---------++ LV SV MOD BP:     80.4 ml   +------------------+---------++  +---------------+---------++ RIGHT VENTRICLE          +---------------+---------++ RV S prime:    9.57 cm/s +---------------+---------++ TAPSE (M-mode):1.5 cm    +---------------+---------++  +---------------+-------++-----------++ LEFT ATRIUM           Index       +---------------+-------++-----------++ LA diam:       2.80 cm1.37 cm/m  +---------------+-------++-----------++ LA Vol (A2C):  89.9 ml44.04 ml/m +---------------+-------++-----------++ LA Vol (A4C):  82.7 ml40.51 ml/m +---------------+-------++-----------++ LA Biplane Vol:89.7 ml43.94 ml/m +---------------+-------++-----------++ +------------+---------++-----------++ RIGHT ATRIUM         Index       +------------+---------++-----------++ RA Area:    14.10 cm            +------------+---------++-----------++ RA Volume:  35.00 ml 17.14 ml/m +------------+---------++-----------++  +------------------+-----------++ AORTIC VALVE                  +------------------+-----------++ AV  Area (Vmax):   3.16 cm    +------------------+-----------++ AV Area (Vmean):  2.79 cm    +------------------+-----------++ AV Area (VTI):    2.75 cm    +------------------+-----------++ AV Vmax:          79.20 cm/s  +------------------+-----------++ AV Vmean:         55.400 cm/s +------------------+-----------++ AV VTI:           0.162 m     +------------------+-----------++ AV Peak Grad:     2.5 mmHg    +------------------+-----------++ AV Mean Grad:     1.0 mmHg    +------------------+-----------++ LVOT Vmax:        65.90 cm/s  +------------------+-----------++ LVOT Vmean:       40.700 cm/s +------------------+-----------++ LVOT VTI:         0.117 m     +------------------+-----------++ LVOT/AV VTI ratio:0.72        +------------------+-----------++   +-------------+-------++ AORTA                +-------------+-------++ Ao Root diam:2.70 cm +-------------+-------++ Ao Asc diam: 3.00 cm +-------------+-------++  +--------------+----------++ MITRAL VALVE             +--------------+-------+ +--------------+----------++ SHUNTS                MV Area (PHT):5.54 cm   +--------------+-------+ +--------------+----------++ Systemic VTI: 0.12 m  MV PHT:       39.73 msec +--------------+-------+ +--------------+----------++ Systemic Diam:2.20 cm MV Decel Time:137 msec   +--------------+-------+ +--------------+----------++ +--------------+----------++ MV E velocity:59.10 cm/s +--------------+----------++ MV A velocity:79.30 cm/s +--------------+----------++ MV E/A ratio: 0.75       +--------------+----------++    Carlyle Dolly MD Electronically signed by Carlyle Dolly MD Signature Date/Time: 01/30/2019/2:33:29 PM      Assessment / Plan:    Acute  stemi with cardiac arrest -with severe three-vessel coronary artery disease respiratory failure with bilateral  pulmonary  infiltrates -now extubated and reintubated secondary to pulmonary secretions continued respiratory failure. severe copd - night time o2 therapy -  seen by pulmonary 12/2018 but pfts not done yet  Stage III ckd  Anatomically the patient would be a candidate for coronary artery bypass grafting, however his current respiratory will have to be significantly improved before we consider proceeding with coronary artery bypass grafting  Grace Isaac MD      [address] [place] [address] Office [phone]   Beeper [phone]  02/10/2019 6:05 PM

## 2019-02-09 NOTE — Consult Note (Signed)
Cardiology Consult    Patient ID: Brent GlasgowBenny R Ikner MRN: [id number], DOB/AGE: [birth year]   Admit date: 02/08/2019 Date of Consult: 02/01/2019  Primary Physician: Blane Oharaox, Kirsten, MD Primary Cardiologist: No primary care provider on file. Requesting Provider: Isabella BowensKrall, MD   Patient Profile    From ICU notes: "68 year old man 70+ packyear active smoker with history of COPD, hypertension, hyperlipidemia, peripheral vascular disease, diverticular disease, sleep apnea (not on CPAP), anxiety presenting with dyspnea found to have STEMI s/p TPA on 02/08/2019 at 2030 at [facility]. Per EMR, patient arrived via EMS with dyspnea and respiratory distress. His dyspnea has been increasing the past 3 weeks and became acutely worse prior to arrival. He was found to be hypoxic with O2 sat in the 80s. He was also found to be wheezing. He was placed on CPAP. He was emergently intubated. No fevers or chest pain.   In the ED, he was found to have ST elevation in AVR with ST depression diffusely otherwise. CXR with RLL opacity. ED discussed with cardiology here and due to the STEMI and possibility of COVID, he was given thrombolytics for his STEMI. COVID testing came back negative. "  [facility] is intubated, and sedated. On Levophed 0.04. Central line In place and he is oozing notably from it. Moves his left arm to stimulation but not right arm. Withdraws to pain  Past Medical History   No past medical history on file.  No past surgical history on file.   Allergies  No Known Allergies    Inpatient Medications    . aspirin  81 mg Per Tube Daily  . buPROPion  50 mg Per Tube TID  . clopidogrel  75 mg Per Tube Daily  . ipratropium-albuterol  3 mL Nebulization Q6H  . pantoprazole sodium  40 mg Per Tube Daily    Family History    No family history on file. has no family status information on file.    Social History    Social History   Socioeconomic History  . Marital status: Single    Spouse name: Not  on file  . Number of children: Not on file  . Years of education: Not on file  . Highest education level: Not on file  Occupational History  . Not on file  Social Needs  . Financial resource strain: Not on file  . Food insecurity    Worry: Not on file    Inability: Not on file  . Transportation needs    Medical: Not on file    Non-medical: Not on file  Tobacco Use  . Smoking status: Current Every Day Smoker    Packs/day: 0.50    Years: 56.00    Pack years: 28.00    Types: Cigarettes  . Smokeless tobacco: Never Used  . Tobacco comment: started at 68 yrs old  Substance and Sexual Activity  . Alcohol use: Not on file  . Drug use: Not on file  . Sexual activity: Not on file  Lifestyle  . Physical activity    Days per week: Not on file    Minutes per session: Not on file  . Stress: Not on file  Relationships  . Social Musicianconnections    Talks on phone: Not on file    Gets together: Not on file    Attends religious service: Not on file    Active member of club or organization: Not on file    Attends meetings of clubs or organizations: Not on file  Relationship status: Not on file  . Intimate partner violence    Fear of current or ex partner: Not on file    Emotionally abused: Not on file    Physically abused: Not on file    Forced sexual activity: Not on file  Other Topics Concern  . Not on file  Social History Narrative  . Not on file     Review of Systems    General:  No chills, fever, night sweats or weight changes.  Cardiovascular:  No chest pain, dyspnea on exertion, edema, orthopnea, palpitations, paroxysmal nocturnal dyspnea. Dermatological: No rash, lesions/masses Respiratory: No cough, dyspnea Urologic: No hematuria, dysuria Abdominal:   No nausea, vomiting, diarrhea, bright red blood per rectum, melena, or hematemesis Neurologic:  No visual changes, wkns, changes in mental status. All other systems reviewed and are otherwise negative except as noted above.   Physical Exam    Blood pressure (!) 122/48, pulse (!) 56, temperature 99 F (37.2 C), temperature source Axillary, resp. rate 18, height 5\' 5"  (1.651 m), weight 97.7 kg, SpO2 100 %.  General: Intub and sedated Psych: NA Neuro: moves left arm to stimulation HEENT: intub  Neck: cant assess. Blood around central line  Lungs: mech breath sounds Heart: brady Abdomen: Soft, non-tender, non-distended, BS + x 4.  Extremities: Cool  Labs    Troponin [facility] of Care Test) No results for input(s): TROPIPOC in the last 72 hours. No results for input(s): CKTOTAL, CKMB, TROPONINI in the last 72 hours. No results found for: WBC, HGB, HCT, MCV, PLT No results for input(s): NA, K, CL, CO2, BUN, CREATININE, CALCIUM, PROT, BILITOT, ALKPHOS, ALT, AST, GLUCOSE in the last 168 hours.  Invalid input(s): LABALBU No results found for: CHOL, HDL, LDLCALC, TRIG No results found for: [facility]   Radiology Studies    Dg Chest 2 View  Result Date: 01/11/2019 CLINICAL DATA:  COPD follow-up EXAM: CHEST - 2 VIEW COMPARISON:  09/13/2018 FINDINGS: Normal heart size. Aortic atherosclerosis. Aortic atherosclerosis. Lungs are hyperinflated and there are coarsened interstitial markings of COPD/emphysema. No superimposed airspace consolidation. The visualized osseous structures are unremarkable. IMPRESSION: 1. No acute cardiopulmonary abnormality. 2. COPD/emphysema. Electronically Signed   By: Kerby Moors M.D.   On: 01/11/2019 17:25    ECG & Cardiac Imaging    Sinus brady. Bordeline ST depressions are present   Assessment & Plan    STEMI: s/p cardiac arrest. S/p TpA. Now intubated and sedated in ICU. Remains on pressor. ECG with improved signs of ischemia. Whether ongoing symptoms are present can not be assessed. Bedside TTE on Sonosite machine indicates that anterolateral wall is akinetic. Suggesting LAD infarct.   Recommendation:  - Cont on Heparin - Cont on plavix - Wean pressors as tolerated - Will discuss  timing of LHC for him with the team. No need for it at this immediate tim point - keep sedation light in order to assess neurological function. Need to be vigilant about possible complication of TPA such as a cranial bleed. Low threshold to consider head CT.  - Obtain formal TTE  Signed, Cristina Gong, MD 02/26/2019, 5:25 AM  For questions or updates, please contact   Please consult [email or web address] for contact info under Cardiology/STEMI.

## 2019-02-09 NOTE — Progress Notes (Signed)
Transported pt to cath lab and back to 2H15 without incident.

## 2019-02-09 NOTE — Progress Notes (Signed)
[facility] for heparin Indication: chest pain/ACS  No Known Allergies  Patient Measurements: Height: 5\' 5"  (165.1 cm) Weight: 215 lb 6.2 oz (97.7 kg) IBW/kg (Calculated) : 61.5 Heparin Dosing Weight: 83.1  Vital Signs: Temp: 99 F (37.2 C) (06/13 0400) Temp Source: Axillary (06/13 0400) BP: 116/47 (06/13 0530) Pulse Rate: 53 (06/13 0530)  Labs: Recent Labs    Mar 07, 2019 0434 March 07, 2019 0523  HGB 14.0 14.3  HCT 43.0 42.0  PLT 270  --   APTT 54*  --   LABPROT 16.2*  --   INR 1.3*  --   HEPARINUNFRC 0.18*  --   CREATININE 1.51*  --   TROPONINI 42.10*  --     Estimated Creatinine Clearance: 49.6 mL/min (A) (by C-G formula based on SCr of 1.51 mg/dL (H)).   Medical History: Past Medical History:  Diagnosis Date  . Anxiety   . COPD (chronic obstructive pulmonary disease) ([place])   . Hypertension   . OSA (obstructive sleep apnea)     Medications:  See medication history  Assessment: 68 yo man transferred from [place] for CP.  He received TPA 100 mg, heparin 4000 unit bolus and heparin drip at 1000 units/hr.  Pharmacy asked to continue heparin infusion. Baseline labs were wnl. Will aim for lower goal first 24 hours of therapy.  Initial heparin level 0.18 units/ml.  Noted bleeding after central line placed Goal of Therapy:  Heparin level 0.3-0.5 units/ml units/ml Monitor platelets by anticoagulation protocol: Yes   Plan:  Continue heparin drip at 1000 units/hr F/u resolution of bleeding before making any rate adjustments  Thanks for allowing pharmacy to be a part of this patient's care.  Excell Seltzer, PharmD Clinical Pharmacist  03-07-19,6:54 AM

## 2019-02-09 NOTE — Interval H&P Note (Signed)
Cath Lab Visit (complete for each Cath Lab visit)  Clinical Evaluation Leading to the Procedure:   ACS: Yes.    Non-ACS:    Anginal Classification: CCS IV  Anti-ischemic medical therapy: Minimal Therapy (1 class of medications)  Non-Invasive Test Results: No non-invasive testing performed  Prior CABG: No previous CABG      History and Physical Interval Note:  2019/02/28 10:50 AM  Brent Carter  has presented today for surgery, with the diagnosis of STEMI.  The various methods of treatment have been discussed with the patient and family. After consideration of risks, benefits and other options for treatment, the patient has consented to  Procedure(s): Coronary/Graft Acute MI Revascularization (N/A) LEFT HEART CATH AND CORONARY ANGIOGRAPHY (N/A) as a surgical intervention.  The patient's history has been reviewed, patient examined, no change in status, stable for surgery.  I have reviewed the patient's chart and labs.  Questions were answered to the patient's satisfaction.     Brent Carter

## 2019-02-09 NOTE — H&P (View-Only) (Signed)
Patient ID: Brent Carter, male   DOB: [birth year], 68 y.o.   MRN: [id number]     Advanced Heart Failure Rounding Note  PCP-Cardiologist: No primary care provider on file.   Subjective:    Patient is awake on vent this morning.  Stable on NE 4 with SBP now up in the 130s-140s.  Co-ox 89%, CVP 12-13.   ECG from [name] per report showed STE AVR and diffuse ST depression. Patient received tPA at [facility], this morning ECG showed no STE in AVR but shows evidence for anteroseptal MI.    Objective:   Weight Range: 97.7 kg Body mass index is 35.84 kg/m.   Vital Signs:   Temp:  [97.8 F (36.6 C)-99 F (37.2 C)] 97.8 F (36.6 C) (06/13 0724) Pulse Rate:  [53-83] 66 (06/13 0900) Resp:  [10-51] 10 (06/13 0900) BP: (105-143)/(43-76) 143/76 (06/13 0900) SpO2:  [98 %-100 %] 99 % (06/13 0900) FiO2 (%):  [40 %-60 %] 40 % (06/13 0851) Weight:  [91.9 kg-97.7 kg] 97.7 kg (06/13 0355) Last BM Date: (PTA)  Weight change: Filed Weights   02/21/2019 0330 02/05/2019 0355  Weight: 91.9 kg 97.7 kg    Intake/Output:   Intake/Output Summary (Last 24 hours) at 02/24/2019 0925 Last data filed at 02/23/2019 0600 Gross per 24 hour  Intake 19.6 ml  Output 130 ml  Net -110.4 ml      Physical Exam    General:  Intubated, awake.  HEENT: Normal Neck: Supple. JVP 9-10 cm. Carotids 2+ bilat; no bruits. No lymphadenopathy or thyromegaly appreciated. Cor: PMI nondisplaced. Regular rate & rhythm. No rubs, gallops or murmurs. Lungs: Decreased BS at bases.  Abdomen: Soft, nontender, nondistended. No hepatosplenomegaly. No bruits or masses. Good bowel sounds. Extremities: No cyanosis, clubbing, rash, edema Neuro: Alert, follows commands.    Telemetry   NSR in 60s (personally reviewed)  EKG    NSR, anteroseptal MI (Qs, slight STE), slight STE AVL  Labs    CBC Recent Labs    02/05/2019 0434 02/21/2019 0523  WBC 18.0*  --   NEUTROABS 16.1*  --   HGB 14.0 14.3  HCT 43.0 42.0  MCV 93.5  --     PLT 270  --    Basic Metabolic Panel Recent Labs    02/26/2019 0434 02/16/2019 0523  NA 134* 135  K 4.3 4.4  CL 100  --   CO2 22  --   GLUCOSE 143*  --   BUN 21  --   CREATININE 1.51*  --   CALCIUM 8.4*  --   MG 2.0  --   PHOS 4.5  --    Liver Function Tests Recent Labs    01/29/2019 0434  AST 204*  ALT 186*  ALKPHOS 80  BILITOT 1.0  PROT 5.7*  ALBUMIN 2.9*   No results for input(s): LIPASE, AMYLASE in the last 72 hours. Cardiac Enzymes Recent Labs    02/23/2019 0434  TROPONINI 42.10*    BNP: BNP (last 3 results) Recent Labs    02/19/2019 0434  BNP 1,279.0*    ProBNP (last 3 results) No results for input(s): PROBNP in the last 8760 hours.   D-Dimer No results for input(s): DDIMER in the last 72 hours. Hemoglobin A1C Recent Labs    02/05/2019 0638  HGBA1C 5.6   Fasting Lipid Panel Recent Labs    02/17/2019 0638  CHOL 115  HDL 36*  LDLCALC 71  TRIG 42  CHOLHDL 3.2   Thyroid Function Tests  No results for input(s): TSH, T4TOTAL, T3FREE, THYROIDAB in the last 72 hours.  Invalid input(s): FREET3  Other results:   Imaging    Dg Chest Port 1 View  Result Date: 02/22/2019 CLINICAL DATA:  STEMI. Acute respiratory failure. Central line and endotracheal tube placement. EXAM: PORTABLE CHEST 1 VIEW COMPARISON:  02/08/2019 FINDINGS: Endotracheal tube remains high in position, with tip at level of clavicles. New nasogastric tube is seen entering the stomach. New right internal jugular central venous catheter is seen with tip overlying the distal SVC. No evidence of pneumothorax. Heart size is stable. Mild worsening of asymmetric airspace disease is seen in both lung bases, right side greater than left. IMPRESSION: 1. New right internal jugular central venous catheter and nasogastric tube in appropriate position. No evidence of pneumothorax. 2. High endotracheal tube position, with tip at level of clavicles. 3. Mild worsening of asymmetric airspace disease in both  lung bases, right side greater than left. Electronically Signed   By: Myles RosenthalJohn  Stahl M.D.   On: 02/13/2019 07:41      Medications:     Scheduled Medications:  arformoterol  15 mcg Nebulization BID   aspirin  81 mg Per Tube Daily   atorvastatin  80 mg Oral q1800   budesonide (PULMICORT) nebulizer solution  0.25 mg Nebulization BID   buPROPion  50 mg Per Tube TID   citalopram  20 mg Per Tube Daily   clonazePAM  0.25 mg Per Tube Daily   clopidogrel  75 mg Per Tube Daily   ipratropium-albuterol  3 mL Nebulization Q6H   pantoprazole sodium  40 mg Per Tube Daily   predniSONE  40 mg Per Tube Daily     Infusions:  azithromycin     cefTRIAXone (ROCEPHIN)  IV     fentaNYL infusion INTRAVENOUS 50 mcg/hr (02/13/2019 0600)   heparin 1,000 Units/hr (01/29/2019 0600)   norepinephrine (LEVOPHED) Adult infusion 4 mcg/min (02/24/2019 0849)   propofol (DIPRIVAN) infusion Stopped (02/01/2019 0450)     PRN Medications:  acetaminophen, albuterol, bisacodyl, docusate, fentaNYL   Assessment/Plan   1. CAD: Anterior STEMI.  [facility].  There was concern for coronavirus and tPA was given while waiting for COVID-19 testing.  This was negative.  This morning, ECG with ASMI with slight STE.   - Patient had tPA last night at 8:30 pm.  I discussed with Dr. Tresa EndoKelly, plan for coronary angiography/potential revascularization today.  - Continue heparin gtt, statin, ASA 81, Plavix 75.  2. Acute systolic CHF: Ischemic cardiomyopathy.  EF low with evidence for anterior MI on bedside echo last night.  CVP 12-13.  He remains on NE 4 with good co-ox.  BP stable now.  - Lasix 40 mg IV bid today.  - Should be able to wean off norepinephrine.  3. Acute hypoxemic respiratory failure:  PNA/COPD exacerbation. Suspect there is a component of pulmonary edema as well.  CXR with infiltrates on CXR (R>L base).  Afebrile but WBCs 18.  - Will cover with ceftriaxone/doxycycline (would avoid  azithromycin).  - He is on prednisone per CCM.  - Diurese as above.  - vent per CCM.  4. COPD: Treating for AECOPD per CCM with abx, prednisone, nebs.  5. ID: Covering for PNA as above.   6. ?AKI: Creatinine 1.5, unsure of baseline. May have in setting of hypotension/cardiac arrest.  7. Cardiac arrest: Prior to tPA, uncertain rhythm, had 4 minutes CPR + epinephrine, no shock. No apparent neurologic damage (alert, follows  commands on vent).  8. Elevated LFTs: Likely related to hypoperfusion with arrest.   CRITICAL CARE Performed by: Loralie Champagne  Total critical care time: 45 minutes  Critical care time was exclusive of separately billable procedures and treating other patients.  Critical care was necessary to treat or prevent imminent or life-threatening deterioration.  Critical care was time spent personally by me on the following activities: development of treatment plan with patient and/or surrogate as well as nursing, discussions with consultants, evaluation of patient's response to treatment, examination of patient, obtaining history from patient or surrogate, ordering and performing treatments and interventions, ordering and review of laboratory studies, ordering and review of radiographic studies, pulse oximetry and re-evaluation of patient's condition.   Length of Stay: 0  Loralie Champagne, MD  02/08/2019, 9:25 AM  Advanced Heart Failure Team Pager [phone] (M-F; 7a - 4p)  Please contact [facility] for night-coverage after hours (4p -7a ) and weekends on [email or web address]

## 2019-02-09 NOTE — Progress Notes (Signed)
Patient admitted to [facility] from [facility] by care link. Patient intubated on vent, not following commands, on levophed drip, heparin drip, versed drip, and fentanyl drip. Vital signs are stable. Foley in place and 4 PIV's in place. Skin assessed by Minerva Fester.  Dr. Randell Patient paged to come assess patient. Will continue to monitor.

## 2019-02-09 NOTE — Progress Notes (Signed)
RR increased per MD order per ABG results.

## 2019-02-10 ENCOUNTER — Inpatient Hospital Stay (HOSPITAL_COMMUNITY): Payer: Medicare Other

## 2019-02-10 ENCOUNTER — Other Ambulatory Visit: Payer: Self-pay

## 2019-02-10 DIAGNOSIS — J810 Acute pulmonary edema: Secondary | ICD-10-CM

## 2019-02-10 DIAGNOSIS — E876 Hypokalemia: Secondary | ICD-10-CM

## 2019-02-10 DIAGNOSIS — J441 Chronic obstructive pulmonary disease with (acute) exacerbation: Secondary | ICD-10-CM

## 2019-02-10 DIAGNOSIS — I2102 ST elevation (STEMI) myocardial infarction involving left anterior descending coronary artery: Secondary | ICD-10-CM

## 2019-02-10 LAB — COMPREHENSIVE METABOLIC PANEL
ALT: 115 U/L — ABNORMAL HIGH (ref 0–44)
AST: 99 U/L — ABNORMAL HIGH (ref 15–41)
Albumin: 2.5 g/dL — ABNORMAL LOW (ref 3.5–5.0)
Alkaline Phosphatase: 61 U/L (ref 38–126)
Anion gap: 10 (ref 5–15)
BUN: 21 mg/dL (ref 8–23)
CO2: 24 mmol/L (ref 22–32)
Calcium: 8 mg/dL — ABNORMAL LOW (ref 8.9–10.3)
Chloride: 101 mmol/L (ref 98–111)
Creatinine, Ser: 1.07 mg/dL (ref 0.61–1.24)
GFR calc Af Amer: >60 mL/min (ref >60)
GFR calc non Af Amer: >60 mL/min (ref >60)
Glucose, Bld: 139 mg/dL — ABNORMAL HIGH (ref 70–99)
Potassium: 2.8 mmol/L — ABNORMAL LOW (ref 3.5–5.1)
Sodium: 135 mmol/L (ref 135–145)
Total Bilirubin: 0.5 mg/dL (ref 0.3–1.2)
Total Protein: 4.9 g/dL — ABNORMAL LOW (ref 6.5–8.1)

## 2019-02-10 LAB — POCT I-STAT 7, (LYTES, BLD GAS, ICA,H+H)
Acid-Base Excess: 3 mmol/L — ABNORMAL HIGH (ref 0.0–2.0)
Acid-Base Excess: 1 mmol/L (ref 0.0–2.0)
Acid-base deficit: 1 mmol/L (ref 0.0–2.0)
Acid-base deficit: 1 mmol/L (ref 0.0–2.0)
Bicarbonate: 27.2 mmol/L (ref 20.0–28.0)
Bicarbonate: 26.8 mmol/L (ref 20.0–28.0)
Bicarbonate: 27.8 mmol/L (ref 20.0–28.0)
Bicarbonate: 26.1 mmol/L (ref 20.0–28.0)
Calcium, Ion: 1.15 mmol/L (ref 1.15–1.40)
Calcium, Ion: 1.18 mmol/L (ref 1.15–1.40)
Calcium, Ion: 1.14 mmol/L — ABNORMAL LOW (ref 1.15–1.40)
Calcium, Ion: 1.13 mmol/L — ABNORMAL LOW (ref 1.15–1.40)
HCT: 33 % — ABNORMAL LOW (ref 39.0–52.0)
HCT: 37 % — ABNORMAL LOW (ref 39.0–52.0)
HCT: 37 % — ABNORMAL LOW (ref 39.0–52.0)
HCT: 35 % — ABNORMAL LOW (ref 39.0–52.0)
Hemoglobin: 11.2 g/dL — ABNORMAL LOW (ref 13.0–17.0)
Hemoglobin: 12.6 g/dL — ABNORMAL LOW (ref 13.0–17.0)
Hemoglobin: 12.6 g/dL — ABNORMAL LOW (ref 13.0–17.0)
Hemoglobin: 11.9 g/dL — ABNORMAL LOW (ref 13.0–17.0)
O2 Saturation: 97 %
O2 Saturation: 95 %
O2 Saturation: 94 %
O2 Saturation: 100 %
Patient temperature: 99
Potassium: 2.9 mmol/L — ABNORMAL LOW (ref 3.5–5.1)
Potassium: 3.8 mmol/L (ref 3.5–5.1)
Potassium: 4.6 mmol/L (ref 3.5–5.1)
Potassium: 4.3 mmol/L (ref 3.5–5.1)
Sodium: 138 mmol/L (ref 135–145)
Sodium: 137 mmol/L (ref 135–145)
Sodium: 136 mmol/L (ref 135–145)
Sodium: 135 mmol/L (ref 135–145)
TCO2: 28 mmol/L (ref 22–32)
TCO2: 29 mmol/L (ref 22–32)
TCO2: 29 mmol/L (ref 22–32)
TCO2: 28 mmol/L (ref 22–32)
pCO2 arterial: 37.4 mmHg (ref 32.0–48.0)
pCO2 arterial: 58.7 mmHg — ABNORMAL HIGH (ref 32.0–48.0)
pCO2 arterial: 52.4 mmHg — ABNORMAL HIGH (ref 32.0–48.0)
pCO2 arterial: 50.1 mmHg — ABNORMAL HIGH (ref 32.0–48.0)
pH, Arterial: 7.47 — ABNORMAL HIGH (ref 7.350–7.450)
pH, Arterial: 7.268 — ABNORMAL LOW (ref 7.350–7.450)
pH, Arterial: 7.332 — ABNORMAL LOW (ref 7.350–7.450)
pH, Arterial: 7.324 — ABNORMAL LOW (ref 7.350–7.450)
pO2, Arterial: 88 mmHg (ref 83.0–108.0)
pO2, Arterial: 87 mmHg (ref 83.0–108.0)
pO2, Arterial: 77 mmHg — ABNORMAL LOW (ref 83.0–108.0)
pO2, Arterial: 330 mmHg — ABNORMAL HIGH (ref 83.0–108.0)

## 2019-02-10 LAB — CBC
HCT: 33.8 % — ABNORMAL LOW (ref 39.0–52.0)
Hemoglobin: 11.4 g/dL — ABNORMAL LOW (ref 13.0–17.0)
MCH: 30.3 pg (ref 26.0–34.0)
MCHC: 33.7 g/dL (ref 30.0–36.0)
MCV: 89.9 fL (ref 80.0–100.0)
Platelets: 239 10*3/uL (ref 150–400)
RBC: 3.76 MIL/uL — ABNORMAL LOW (ref 4.22–5.81)
RDW: 13.5 % (ref 11.5–15.5)
WBC: 16.1 10*3/uL — ABNORMAL HIGH (ref 4.0–10.5)
nRBC: 0 % (ref 0.0–0.2)

## 2019-02-10 LAB — TROPONIN I: Troponin I: 29.52 ng/mL (ref <0.03)

## 2019-02-10 LAB — COOXEMETRY PANEL
Carboxyhemoglobin: 1.2 % (ref 0.5–1.5)
Methemoglobin: 0.9 % (ref 0.0–1.5)
O2 Saturation: 72.4 %
Total hemoglobin: 12.1 g/dL (ref 12.0–16.0)

## 2019-02-10 LAB — HEPARIN LEVEL (UNFRACTIONATED)
Heparin Unfractionated: <0.1 IU/mL — ABNORMAL LOW (ref 0.30–0.70)
Heparin Unfractionated: 0.21 IU/mL — ABNORMAL LOW (ref 0.30–0.70)
Heparin Unfractionated: 0.49 IU/mL (ref 0.30–0.70)

## 2019-02-10 LAB — TRIGLYCERIDES: Triglycerides: 87 mg/dL (ref <150)

## 2019-02-10 LAB — CULTURE, RESPIRATORY

## 2019-02-10 LAB — MAGNESIUM: Magnesium: 1.9 mg/dL (ref 1.7–2.4)

## 2019-02-10 LAB — PHOSPHORUS: Phosphorus: 1.4 mg/dL — ABNORMAL LOW (ref 2.5–4.6)

## 2019-02-10 MED ORDER — FENTANYL CITRATE (PF) 100 MCG/2ML IJ SOLN
INTRAMUSCULAR | Status: AC
  Administered 2019-02-10: 100 ug
  Filled 2019-02-10: qty 2

## 2019-02-10 MED ORDER — POTASSIUM CHLORIDE 20 MEQ/15ML (10%) PO SOLN
40.0000 meq | ORAL | Status: AC
  Administered 2019-02-10: 05:00:00 40 meq
  Filled 2019-02-10: qty 30

## 2019-02-10 MED ORDER — ETOMIDATE 2 MG/ML IV SOLN
20.0000 mg | Freq: Once | INTRAVENOUS | Status: AC
  Administered 2019-02-10: 20 mg via INTRAVENOUS

## 2019-02-10 MED ORDER — POTASSIUM PHOSPHATES 15 MMOLE/5ML IV SOLN
30.0000 mmol | Freq: Once | INTRAVENOUS | Status: AC
  Administered 2019-02-10: 30 mmol via INTRAVENOUS
  Filled 2019-02-10: qty 10

## 2019-02-10 MED ORDER — CHLORHEXIDINE GLUCONATE 0.12% ORAL RINSE (MEDLINE KIT)
15.0000 mL | Freq: Two times a day (BID) | OROMUCOSAL | Status: DC
  Administered 2019-02-10 – 2019-02-13 (×7): 15 mL via OROMUCOSAL

## 2019-02-10 MED ORDER — FENTANYL BOLUS VIA INFUSION
25.0000 ug | INTRAVENOUS | Status: DC | PRN
  Filled 2019-02-10: qty 25

## 2019-02-10 MED ORDER — CHLORHEXIDINE GLUCONATE 0.12% ORAL RINSE (MEDLINE KIT): 15.0000 mL | Freq: Two times a day (BID) | OROMUCOSAL | Status: DC

## 2019-02-10 MED ORDER — FENTANYL 2500MCG IN NS 250ML (10MCG/ML) PREMIX INFUSION
25.0000 ug/h | INTRAVENOUS | Status: DC
  Administered 2019-02-10: 18:00:00 50 ug/h via INTRAVENOUS
  Administered 2019-02-11: 23:00:00 75 ug/h via INTRAVENOUS
  Filled 2019-02-10 (×2): qty 250

## 2019-02-10 MED ORDER — FENTANYL CITRATE (PF) 100 MCG/2ML IJ SOLN: 25.0000 ug | Freq: Once | INTRAMUSCULAR | Status: DC

## 2019-02-10 MED ORDER — FUROSEMIDE 10 MG/ML IJ SOLN
40.0000 mg | Freq: Four times a day (QID) | INTRAMUSCULAR | Status: AC
  Administered 2019-02-10 (×3): 40 mg via INTRAVENOUS
  Filled 2019-02-10 (×3): qty 4

## 2019-02-10 MED ORDER — DEXMEDETOMIDINE HCL IN NACL 200 MCG/50ML IV SOLN
0.0000 ug/kg/h | INTRAVENOUS | Status: AC
  Administered 2019-02-10 – 2019-02-11 (×5): 0.4 ug/kg/h via INTRAVENOUS
  Administered 2019-02-11: 0.2 ug/kg/h via INTRAVENOUS
  Administered 2019-02-12: 14:00:00 0.4 ug/kg/h via INTRAVENOUS
  Administered 2019-02-12: 05:00:00 0.2 ug/kg/h via INTRAVENOUS
  Filled 2019-02-10: qty 50
  Filled 2019-02-10: qty 100
  Filled 2019-02-10 (×3): qty 50
  Filled 2019-02-10: qty 100
  Filled 2019-02-10: qty 50

## 2019-02-10 MED ORDER — SPIRONOLACTONE 12.5 MG HALF TABLET
12.5000 mg | ORAL_TABLET | Freq: Every day | ORAL | Status: DC
  Administered 2019-02-10 – 2019-02-11 (×2): 12.5 mg via ORAL
  Filled 2019-02-10 (×2): qty 1

## 2019-02-10 MED ORDER — ORAL CARE MOUTH RINSE: 15.0000 mL | OROMUCOSAL | Status: DC

## 2019-02-10 MED ORDER — FUROSEMIDE 10 MG/ML IJ SOLN: 40.0000 mg | Freq: Once | INTRAMUSCULAR | Status: DC

## 2019-02-10 MED ORDER — POTASSIUM CHLORIDE CRYS ER 20 MEQ PO TBCR
40.0000 meq | EXTENDED_RELEASE_TABLET | Freq: Three times a day (TID) | ORAL | Status: DC
  Filled 2019-02-10: qty 2

## 2019-02-10 MED ORDER — PREDNISONE 20 MG PO TABS
40.0000 mg | ORAL_TABLET | Freq: Every day | ORAL | Status: DC
  Administered 2019-02-10 – 2019-02-11 (×2): 40 mg via ORAL
  Filled 2019-02-10 (×3): qty 2

## 2019-02-10 MED ORDER — ROCURONIUM BROMIDE 50 MG/5ML IV SOLN: 50.0000 mg | Freq: Once | INTRAVENOUS | Status: DC

## 2019-02-10 MED ORDER — DIGOXIN 125 MCG PO TABS
0.1250 mg | ORAL_TABLET | Freq: Every day | ORAL | Status: DC
  Administered 2019-02-10 – 2019-02-11 (×2): 0.125 mg via ORAL
  Filled 2019-02-10 (×2): qty 1

## 2019-02-10 MED ORDER — ORAL CARE MOUTH RINSE
15.0000 mL | OROMUCOSAL | Status: DC
  Administered 2019-02-10 – 2019-02-12 (×16): 15 mL via OROMUCOSAL

## 2019-02-10 MED ORDER — MAGNESIUM SULFATE 2 GM/50ML IV SOLN
2.0000 g | Freq: Once | INTRAVENOUS | Status: AC
  Administered 2019-02-10: 2 g via INTRAVENOUS
  Filled 2019-02-10: qty 50

## 2019-02-10 MED ORDER — POTASSIUM CHLORIDE 10 MEQ/50ML IV SOLN
10.0000 meq | INTRAVENOUS | Status: AC
  Administered 2019-02-10 (×6): 10 meq via INTRAVENOUS
  Filled 2019-02-10 (×6): qty 50

## 2019-02-10 MED ORDER — POTASSIUM CHLORIDE 20 MEQ PO PACK: 40.0000 meq | PACK | Freq: Once | ORAL | Status: DC

## 2019-02-10 MED ORDER — BUPROPION HCL ER (XL) 150 MG PO TB24
150.0000 mg | ORAL_TABLET | Freq: Every day | ORAL | Status: DC
  Administered 2019-02-10 – 2019-02-11 (×2): 150 mg via ORAL
  Filled 2019-02-10 (×2): qty 1

## 2019-02-10 MED ORDER — CITALOPRAM HYDROBROMIDE 20 MG PO TABS
20.0000 mg | ORAL_TABLET | Freq: Every day | ORAL | Status: DC
  Administered 2019-02-10 – 2019-02-11 (×2): 20 mg via ORAL
  Filled 2019-02-10 (×2): qty 1

## 2019-02-10 NOTE — Progress Notes (Signed)
eLink Physician-Brief Progress Note Patient Name: Brent Carter DOB: [birth year] MRN: [id number]   Date of Service  02/10/2019  HPI/Events of Note  Hypokalemia, hypophos, hypomag  eICU Interventions  Potassium, phos, and mag replaced     Intervention Category Intermediate Interventions: Electrolyte abnormality - evaluation and management  Evea Sheek 02/10/2019, 5:04 AM

## 2019-02-10 NOTE — Progress Notes (Signed)
ANTICOAGULATION CONSULT NOTE   Pharmacy Consult for heparin Indication: chest pain/ACS  No Known Allergies  Patient Measurements: Height: 5\' 5"  (165.1 cm) Weight: 210 lb 8.6 oz (95.5 kg) IBW/kg (Calculated) : 61.5 Heparin Dosing Weight: 83.1  Vital Signs: Temp: 97.5 F (36.4 C) (06/14 1544) Temp Source: Axillary (06/14 1544) BP: 114/71 (06/14 1600) Pulse Rate: 103 (06/14 1600)  Labs: Recent Labs    02/02/2019 0434  02/20/2019 1639 02/22/2019 2258 02/10/19 0354 02/10/19 0442 02/10/19 0804 02/10/19 1130 02/10/19 1557 02/10/19 1559  HGB 14.0   < >  --   --  11.4* 11.2*  --  12.6*  --  12.6*  HCT 43.0   < >  --   --  33.8* 33.0*  --  37.0*  --  37.0*  PLT 270  --   --   --  239  --   --   --   --   --   APTT 54*  --   --   --   --   --   --   --   --   --   LABPROT 16.2*  --   --   --   --   --   --   --   --   --   INR 1.3*  --   --   --   --   --   --   --   --   --   HEPARINUNFRC 0.18*  --   --  <0.10*  --   --  0.21*  --  0.49  --   CREATININE 1.51*  --   --   --  1.07  --   --   --   --   --   TROPONINI 42.10*  --  54.52* 29.52*  --   --   --   --   --   --    < > = values in this interval not displayed.    Estimated Creatinine Clearance: 69.2 mL/min (by C-G formula based on SCr of 1.07 mg/dL).   Medical History: Past Medical History:  Diagnosis Date  . Anxiety   . COPD (chronic obstructive pulmonary disease) ([place])   . Hypertension   . OSA (obstructive sleep apnea)      Assessment: 13 yoM transferred from [place] with STEMI s/p tPA. Pt started on heparin and then taken to the cath lab today revealing 3-vessel CAD. Heparin restarted 6hr after sheath pull.   Heparin now therapeutic on 1400 units/hr. Given tPA was administered >24 hr ago will increase goal to 0.3-0.7.  Goal of Therapy:  Heparin level 0.3-0.7 units/ml Monitor platelets by anticoagulation protocol: Yes   Plan:  Continue heparin at 1400 units/hr Daily heparin level and CBC   Arrie Senate, PharmD, BCPS Clinical Pharmacist [phone] Please check AMION for all [facility] numbers 02/10/2019

## 2019-02-10 NOTE — Progress Notes (Signed)
Patient starting having respiratory distress around 12 AM.  Patient was placed on BiPAP.  Patient continued to deteriorate and did not respond to lasix 120 mg and BP started dropping.  At 6 PM patient deteriorated from a respiratory standpoint and HR increased.  CXR was performed and pulmonary edema was noted.  Crackles on exam.  Decision made to reintubate at this point and keep comfortable until cardiac condition is stabilized.  The patient is critically ill with multiple organ systems failure and requires high complexity decision making for assessment and support, frequent evaluation and titration of therapies, application of advanced monitoring technologies and extensive interpretation of multiple databases.   Critical Care Time devoted to patient care services described in this note is  60  Minutes. This time reflects time of care of this signee Dr Jennet Maduro. This critical care time does not reflect procedure time, or teaching time or supervisory time of PA/NP/Med student/Med Resident etc but could involve care discussion time.  Rush Farmer, M.D. [facility] Medicine. Pager: [phone]. After hours pager: [phone].

## 2019-02-10 NOTE — Procedures (Signed)
Intubation Procedure Note LETROY VAZGUEZ [id number] 1951-02-01  Procedure: Intubation Indications: Respiratory insufficiency  Procedure Details Consent: Risks of procedure as well as the alternatives and risks of each were explained to the (patient/caregiver).  Consent for procedure obtained. Time Out: Verified patient identification, verified procedure, site/side was marked, verified correct patient position, special equipment/implants available, medications/allergies/relevent history reviewed, required imaging and test results available.  Performed  Maximum sterile technique was used including gloves, hand hygiene and mask.  MAC    Evaluation Hemodynamic Status: BP stable throughout; O2 sats: stable throughout Patient's Current Condition: stable Complications: No apparent complications Patient did tolerate procedure well. Chest X-ray ordered to verify placement.  CXR: pending.   Jennet Maduro 02/10/2019

## 2019-02-10 NOTE — Progress Notes (Signed)
Assisted tele visit to patient with son.  Kennethia Lynes Ann, RN  

## 2019-02-10 NOTE — Progress Notes (Signed)
NAME:  ORDEAN FOUTS, MRN:  [id number], DOB:  November 29, 1950, LOS: 1 ADMISSION DATE:  02/12/2019, CONSULTATION DATE:  01/31/2019 REFERRING MD:  [facility], CHIEF COMPLAINT:  STEMI  Brief History   68 year old man 72+ packyear active smoker with history of COPD, hypertension, hyperlipidemia, peripheral vascular disease, diverticular disease, sleep apnea (not on CPAP), anxiety presented with dyspnea found to have acute hypoxic and hypercarbic respiratory failure, 4 min cardiac arrest, STEMI s/p TPA on 02/08/2019 at 2030 and transferred here from [place].  History of present illness   68 year old man 67+ packyear active smoker with history of COPD, hypertension, hyperlipidemia, peripheral vascular disease, diverticular disease, sleep apnea (not on CPAP), anxiety presenting with dyspnea found to have STEMI s/p alteplase on 02/08/2019 at 2030 at [place]. Per EMR, patient arrived via EMS with dyspnea and respiratory distress. His dyspnea has been increasing the past 3 weeks and became acutely worse prior to arrival. He was found to be hypoxic with O2 sat in the 80s. He was also found to be wheezing. He was placed on CPAP. He was emergently intubated. No fevers or chest pain.   In the ED, he was found to have ST elevation in AVR with ST depression diffusely otherwise. CXR with RLL opacity. ED discussed with cardiology here and due to the STEMI and possibility of COVID, he was given thrombolytics for his STEMI. COVID testing came back negative. He received alteplase, ASA 347m. He had resolution of the ST changes.  Per [facility] report, he had cardiac arrest and received 4 min of CPR and Epi 110m No defibrillation. Per EMR appears to have had it prior to the alteplase.   He was seen by Dr. MaVaughan Browner/15/2020 for COPD and recently switched from [facility].  Past Medical History  COPD, hypertension, hyperlipidemia, peripheral vascular disease, diverticular disease, sleep apnea (not on CPAP), anxiety   Significant Hospital Events   6/12>> Presented to ED 6/13>> [facility]red [facility] 6/13>  Procedures:  CVC 6/13>  Significant Diagnostic Tests:  CXR 02/10/2019: asymmetric airspace disease in RLL, hyperinflation. No pneumothorax  EKG 02/08/2019: ST elevation in AVR, ST depression diffusely  OSH labs 02/08/2019 WBC 14.4, Hgb 15.4, Plt 295, ANC 9.22 DDimer 6403 PT 11.3, INR 1.1 APTT 23.3 (@2015 ) ABG 7.29/53/406 Na 135, K 4.7, CO2 21, cr 0.9, BUN 15 Troponin 1.32 (@1906 ) NT-proBNP 4680 T bili 1, AST 303, ALT 201, alk phos 107 Procal 0.05 SARS-CoV-2 RNA not detected  Micro Data:  BCx 6/13> Tracheal aspirate 6/13>  Antimicrobials:  Ceftriaxone 6/12> Azithromycin 6/12>  Interim history/subjective:  Weaning well this AM  Objective   Blood pressure 105/62, pulse (!) 123, temperature 98.4 F (36.9 C), temperature source Oral, resp. rate (!) 22, height 5' 5"  (1.651 m), weight 95.5 kg, SpO2 94 %. CVP:  [8 mmHg-14 mmHg] 14 mmHg  Vent Mode: BIPAP FiO2 (%):  [40 %-100 %] 50 % Set Rate:  [24 bmp] 24 bmp Vt Set:  [490 mL] 490 mL PEEP:  [5 cmH20] 5 cmH20 Pressure Support:  [5 cmH20] 5 cmH20 Plateau Pressure:  [15 cmH20-22 cmH20] 22 cmH20   Intake/Output Summary (Last 24 hours) at 02/10/2019 0930 Last data filed at 02/10/2019 0640 Gross per 24 hour  Intake 1962.92 ml  Output 2210 ml  Net -247.08 ml   Filed Weights   02/15/2019 0330 01/31/2019 0355 02/10/19 0349  Weight: 91.9 kg 97.7 kg 95.5 kg   Examination: General: Well appearing, NAD HENT: [place]/AT, PERRL,  EOM-I and MMM, ETT in place  Lungs: Decreased BS diffusely  Cardiovascular: RRR, Nl S1/S2 and -M/R/G Abdomen: Soft, NT, ND and +BS Extremities: Diffuse erythema but no edema or tenderness  Neuro: Of sedation, arousable and following commands GU: Foley in place  Assessment & Plan:  68 year old man 70+ packyear active smoker with history of COPD, hypertension, hyperlipidemia,  peripheral vascular disease, diverticular disease, sleep apnea (not on CPAP), anxiety presented with dyspnea found to have acute hypoxic and hypercarbic respiratory failure, 4 min cardiac arrest, STEMI s/p TPA on 02/08/2019 at 2030 and transferred here from [place].  Acute hypoxic and hypercarbic respiratory failure:  - IS - Flutter valve - Titrate O2 for sat of 90-95% - BD as ordered - Extubate  CAP, COPD exacerbation: - Brovana, Pulmicort neb BID - Duonebs scheduled and albprn wheezing/dyspnea - Continue ceftriaxone for 5 days and azithromycin 517m for 3 days - Prednisone 466mx 5 days  STEMI/CAD, cardiac arrest: - Cardiology consulted, will be taken to the cath lab today - Heparin gtt, asa, plavix - Hold off on statin for now as he has some transaminitis - TTE with very low EF - Lipid panel, A1c - Active diureses  Shock, septic versus cardiogenic, transaminitis, hx HTN:  - Obtain venous O2 sat from central line - 86%, no need for dobutamine - D/C levophed - Lasix 40 mg IV q6 x3 doses - Replace K, Mg and Phos - Hold home antihypertensives  Anxiety: - Continue home Wellbutrin, citalopram.  - Continue home Klonopin at half dose to prevent withdrawal  - D/C fentanyl, PRN versed  Discussed with cardiology  Best practice:  Diet: NPO Pain/Anxiety/Delirium protocol (if indicated): Fent gtt and prn, discontinued versed gtt and can start propofol if needed for goal RASS 0 VAP protocol (if indicated): PPI daily DVT prophylaxis: hep gtt GI prophylaxis: PPI daily Glucose control: Monitor Mobility: OOB when able Code Status: FULL Family Communication: Updated wife and son via phone Disposition: ICU  Labs   CBC: Recent Labs  Lab 02/19/2019 0434 02/04/2019 0523 02/10/19 0354 02/10/19 0442  WBC 18.0*  --  16.1*  --   NEUTROABS 16.1*  --   --   --   HGB 14.0 14.3 11.4* 11.2*  HCT 43.0 42.0 33.8* 33.0*  MCV 93.5  --  89.9  --   PLT 270  --  239  --     Basic Metabolic  Panel: Recent Labs  Lab 02/25/2019 0434 02/02/2019 0523 02/10/19 0354 02/10/19 0442  NA 134* 135 135 138  K 4.3 4.4 2.8* 2.9*  CL 100  --  101  --   CO2 22  --  24  --   GLUCOSE 143*  --  139*  --   BUN 21  --  21  --   CREATININE 1.51*  --  1.07  --   CALCIUM 8.4*  --  8.0*  --   MG 2.0  --  1.9  --   PHOS 4.5  --  1.4*  --    Recent Labs  Lab 02/08/2019 0434 02/10/19 0354  WBC 18.0* 16.1*  LATICACIDVEN 1.2  --    ABG    Component Value Date/Time   PHART 7.470 (H) 02/10/2019 0442   PCO2ART 37.4 02/10/2019 0442   PO2ART 88.0 02/10/2019 0442   HCO3 27.2 02/10/2019 0442   TCO2 28 02/10/2019 0442   ACIDBASEDEF 4.0 (H) 02/16/2019 0523   O2SAT 72.4 02/10/2019 0508   The patient is critically ill  with multiple organ systems failure and requires high complexity decision making for assessment and support, frequent evaluation and titration of therapies, application of advanced monitoring technologies and extensive interpretation of multiple databases.   Critical Care Time devoted to patient care services described in this note is  32  Minutes. This time reflects time of care of this signee Dr Jennet Maduro. This critical care time does not reflect procedure time, or teaching time or supervisory time of PA/NP/Med student/Med Resident etc but could involve care discussion time.  Rush Farmer, M.D. [facility] Pulmonary/Critical Care Medicine. Pager: [phone]. After hours pager: [phone].

## 2019-02-10 NOTE — Progress Notes (Signed)
eLink Physician-Brief Progress Note Patient Name: Brent Carter DOB: April 11, 1951 MRN: [id number]   Date of Service  02/10/2019  HPI/Events of Note  Frequent ectopies noted. Patient received 3 doses of furosemide today.  eICU Interventions  Check BMET and Mg level and correct electrolytes as needed     Intervention Category Major Interventions: Arrhythmia - evaluation and management Intermediate Interventions: Electrolyte abnormality - evaluation and management  Judd Lien 02/10/2019, 11:11 PM

## 2019-02-10 NOTE — Progress Notes (Signed)
Advanced ETT from 25 to 27 at the lip post XRAY. Per MD.

## 2019-02-10 NOTE — Progress Notes (Signed)
Patient ID: Brent Carter, male   DOB: 06-03-1951, 68 y.o.   MRN: [id number]     Advanced Heart Failure Rounding Note  PCP-Cardiologist: No primary care provider on file.   Subjective:    Patient received tPA at [facility] on 6/1. Cath on 6/13 with severe 3v CAD including 90% ostial LAD. Echo EF 30-35% LVEDP 15  Has been seen by TCTS. Pending CABG.   Extubated this am. Now on BIPAP. On NE at 40. Co-ox 72%   Objective:   Weight Range: 95.5 kg Body mass index is 35.04 kg/m.   Vital Signs:   Temp:  [98.1 F (36.7 C)-99.2 F (37.3 C)] 98.4 F (36.9 C) (06/14 0757) Pulse Rate:  [0-126] 123 (06/14 0907) Resp:  [0-27] 22 (06/14 0907) BP: (101-147)/(45-89) 105/62 (06/14 0907) SpO2:  [0 %-100 %] 94 % (06/14 0907) FiO2 (%):  [40 %-100 %] 50 % (06/14 0907) Weight:  [95.5 kg] 95.5 kg (06/14 0349) Last BM Date: (PTA)  Weight change: Filed Weights   02/06/2019 0330 02/15/2019 0355 02/10/19 0349  Weight: 91.9 kg 97.7 kg 95.5 kg    Intake/Output:   Intake/Output Summary (Last 24 hours) at 02/10/2019 0910 Last data filed at 02/10/2019 0640 Gross per 24 hour  Intake 1962.92 ml  Output 2210 ml  Net -247.08 ml      Physical Exam    General:  On bipap Appears comfortable now  HEENT: normal Neck: supple. JVP to jaw.  RIJ TLC. Carotids 2+ bilat; no bruits. No lymphadenopathy or thryomegaly appreciated. Cor: PMI nondisplaced. Tachy regular. No obvious murmur Lungs: coarse. No wheeze Abdomen: soft, nontender, nondistended. No hepatosplenomegaly. No bruits or masses. Good bowel sounds. Extremities: no cyanosis, clubbing, rash, edema diffuse ecchymosis Neuro: alert & orientedx3, cranial nerves grossly intact. moves all 4 extremities w/o difficulty. Affect pleasant   Telemetry   Sinus tach 100-110 (personally reviewed)  EKG    NSR, anteroseptal MI (Qs, slight STE), slight STE AVL  Labs    CBC Recent Labs    02/13/2019 0434  02/10/19 0354 02/10/19 0442  WBC 18.0*  --  16.1*  --    NEUTROABS 16.1*  --   --   --   HGB 14.0   < > 11.4* 11.2*  HCT 43.0   < > 33.8* 33.0*  MCV 93.5  --  89.9  --   PLT 270  --  239  --    < > = values in this interval not displayed.   Basic Metabolic Panel Recent Labs    02/01/2019 0434  02/10/19 0354 02/10/19 0442  NA 134*   < > 135 138  K 4.3   < > 2.8* 2.9*  CL 100  --  101  --   CO2 22  --  24  --   GLUCOSE 143*  --  139*  --   BUN 21  --  21  --   CREATININE 1.51*  --  1.07  --   CALCIUM 8.4*  --  8.0*  --   MG 2.0  --  1.9  --   PHOS 4.5  --  1.4*  --    < > = values in this interval not displayed.   Liver Function Tests Recent Labs    02/12/2019 0434 02/10/19 0354  AST 204* 99*  ALT 186* 115*  ALKPHOS 80 61  BILITOT 1.0 0.5  PROT 5.7* 4.9*  ALBUMIN 2.9* 2.5*   No results for input(s): LIPASE, AMYLASE in  the last 72 hours. Cardiac Enzymes Recent Labs    02/19/2019 0434 01/30/2019 1639 02/10/2019 2258  TROPONINI 42.10* 54.52* 29.52*    BNP: BNP (last 3 results) Recent Labs    02/12/2019 0434  BNP 1,279.0*    ProBNP (last 3 results) No results for input(s): PROBNP in the last 8760 hours.   D-Dimer No results for input(s): DDIMER in the last 72 hours. Hemoglobin A1C Recent Labs    02/19/2019 0638  HGBA1C 5.6   Fasting Lipid Panel Recent Labs    02/04/2019 0638 02/10/19 0354  CHOL 115  --   HDL 36*  --   LDLCALC 71  --   TRIG 42 87  CHOLHDL 3.2  --    Thyroid Function Tests No results for input(s): TSH, T4TOTAL, T3FREE, THYROIDAB in the last 72 hours.  Invalid input(s): FREET3  Other results:   Imaging    Dg Chest Port 1 View  Result Date: 02/10/2019 CLINICAL DATA:  Intubation EXAM: PORTABLE CHEST 1 VIEW COMPARISON:  02/01/2019 FINDINGS: Endotracheal tube 9.2 cm above the carina. Right IJ central line tip upper SVC level. NG tube enters the stomach. No enlarging effusion or pneumothorax. Improvement in the asymmetric predominately interstitial opacities throughout the mid and lower  lungs, worse on the right with peripheral Kerley B-lines. Suspect improvement in the pulmonary edema pattern. Difficult to exclude basilar pneumonia. Aorta atherosclerotic. IMPRESSION: Slight improvement in pulmonary edema pattern. Stable support apparatus Electronically Signed   By: Jerilynn Mages.  Shick M.D.   On: 02/10/2019 08:59     Medications:     Scheduled Medications: . arformoterol  15 mcg Nebulization BID  . aspirin  81 mg Oral Daily  . atorvastatin  80 mg Oral q1800  . budesonide (PULMICORT) nebulizer solution  0.25 mg Nebulization BID  . buPROPion  50 mg Per Tube TID  . chlorhexidine gluconate (MEDLINE KIT)  15 mL Mouth Rinse BID  . citalopram  20 mg Per Tube Daily  . clonazePAM  0.25 mg Per Tube Daily  . furosemide  40 mg Intravenous BID  . ipratropium-albuterol  3 mL Nebulization Q6H  . mouth rinse  15 mL Mouth Rinse 10 times per day  . pantoprazole sodium  40 mg Per Tube Daily  . potassium chloride  40 mEq Per Tube Q4H  . predniSONE  40 mg Per Tube Daily  . sodium chloride flush  3 mL Intravenous Q12H    Infusions: . sodium chloride 100 mL/hr at 02/10/19 0600  . sodium chloride Stopped (02/10/19 0520)  . cefTRIAXone (ROCEPHIN)  IV Stopped (02/25/2019 2055)  . doxycycline (VIBRAMYCIN) IV Stopped (02/23/2019 2340)  . fentaNYL infusion INTRAVENOUS 50 mcg/hr (02/16/2019 1058)  . heparin 1,200 Units/hr (02/10/19 0600)  . norepinephrine (LEVOPHED) Adult infusion 8 mcg/min (02/10/2019 1825)  . potassium PHOSPHATE IVPB (in mmol) 30 mmol (02/10/19 0629)    PRN Medications: sodium chloride, acetaminophen, albuterol, bisacodyl, docusate, fentaNYL, midazolam, ondansetron (ZOFRAN) IV, sodium chloride flush        Assessment/Plan   1. CAD: Anterior STEMI.  Patient [facility] with [facility]I on 6/12.  There was concern for coronavirus and tPA was given while waiting for COVID-19 testing.  This was negative.  - Peak trop 54.5 -> 29.5 - Cath with 6/13 with 3v CAD. EF 30-35% -  Continue heparin gtt, statin, ASA 81. Got 1 dose Plavix 75 on 6/13 - TCTS has seen for possible CABG. Will need to optimize hemodynamics and respiratory status first. Continue diuresis and treatment of  PNA - Check P2Y12 in am   2. Acute systolic CHF: Ischemic cardiomyopathy.  EF 30% - CVP 15. Will give lasix 40IV tid  - Remains on NE 8. Co-ox 72%. Wean NE as tolerated - Add dig and spiro.  - No b-blocker yet while on NE  3. Acute hypoxemic respiratory failure:  PNA/COPD exacerbation. Suspect there is a component of pulmonary edema as well.  CXR with infiltrates on CXR (R>L base).   - Will cover with ceftriaxone/doxycycline (would avoid azithromycin).  - He is on prednisone per CCM.  - Extubated this am but struggled. Now on BIPAP. Doing ok . - 4. COPD:  - Treating for AECOPD per CCM with abx, prednisone, nebs.   6. ?AKI: Creatinine 1.5, unsure of baseline. May have in setting of hypotension/cardiac arrest.  - Resolved. Creatinine 1.07 today  7. Cardiac arrest:  - Prior to tPA, uncertain rhythm, had 4 minutes CPR + epinephrine, no shock. No apparent neurologic damage (alert, follows commands on vent).   8. Elevated LFTs:  - Likely related to hypoperfusion with arrest.   9. ABL anemia - hgb 14 -> 11 in setting of t-PA and bleeding from central line. Now improved. Will follow.   CRITICAL CARE Performed by: Glori Bickers  Total critical care time: 35 minutes  Critical care time was exclusive of separately billable procedures and treating other patients.  Critical care was necessary to treat or prevent imminent or life-threatening deterioration.  Critical care was time spent personally by me on the following activities: development of treatment plan with patient and/or surrogate as well as nursing, discussions with consultants, evaluation of patient's response to treatment, examination of patient, obtaining history from patient or surrogate, ordering and performing treatments and  interventions, ordering and review of laboratory studies, ordering and review of radiographic studies, pulse oximetry and re-evaluation of patient's condition.   Length of Stay: 1  Glori Bickers, MD  02/10/2019, 9:10 AM  Advanced Heart Failure Team Pager [phone] (M-F; [place])  Please contact [facility] for night-coverage after hours (4p -7a ) and weekends on [email or web address]

## 2019-02-10 NOTE — Progress Notes (Signed)
Pt wasn't tolerating extubation. Pt was SOB and not able to maintain saturations with 6L [place]. Placed back on BIPAP and saturations improved. Pt is stable at this time. RT will continue to monitor.

## 2019-02-10 NOTE — Procedures (Signed)
Extubation Procedure Note  Patient Details:   Name: Brent Carter DOB: [birth year] MRN: [id number]   Airway Documentation:    Vent end date: 02/10/19 Vent end time: 0855   Evaluation  O2 sats: transiently fell during during procedure Complications: No apparent complications Patient did tolerate procedure well. Bilateral Breath Sounds: Clear, Diminished   Yes    Extubated pt and placed on 3 L [place]. Cuff leak was noted.   Ronaldo Miyamoto 02/10/2019, 9:10 AM

## 2019-02-10 NOTE — Progress Notes (Signed)
[place] for heparin Indication: chest pain/ACS  No Known Allergies  Patient Measurements: Height: 5\' 5"  (165.1 cm) Weight: 215 lb 6.2 oz (97.7 kg) IBW/kg (Calculated) : 61.5 Heparin Dosing Weight: 83.1  Vital Signs: Temp: 99.2 F (37.3 C) (06/13 2300) Temp Source: Oral (06/13 2300) BP: 131/62 (06/14 0000) Pulse Rate: 87 (06/14 0000)  Labs: Recent Labs    02/05/2019 0434 02/13/2019 0523 02/13/2019 1639 02/25/2019 2258  HGB 14.0 14.3  --   --   HCT 43.0 42.0  --   --   PLT 270  --   --   --   APTT 54*  --   --   --   LABPROT 16.2*  --   --   --   INR 1.3*  --   --   --   HEPARINUNFRC 0.18*  --   --  <0.10*  CREATININE 1.51*  --   --   --   TROPONINI 42.10*  --  54.52*  --     Estimated Creatinine Clearance: 49.6 mL/min (A) (by C-G formula based on SCr of 1.51 mg/dL (H)).   Medical History: Past Medical History:  Diagnosis Date  . Anxiety   . COPD (chronic obstructive pulmonary disease) ([place])   . Hypertension   . OSA (obstructive sleep apnea)      Assessment: 61 yoM transferred from [place] with STEMI s/p tPA. Pt started on heparin and then taken to the cath lab today revealing 3-vessel CAD. Heparin restarted 6hr after sheath pull.  Heparin level  now <0.1 units/ml No further bleeding noted  Goal of Therapy:  Heparin level 0.3-0.5 units/ml units/ml Monitor platelets by anticoagulation protocol: Yes   Plan:  Increase heparin drip to 1200 units/hr Check heparin level @ 0800 Watch S/Sx bleeding closely  Thanks for allowing pharmacy to be a part of this patient's care.  Excell Seltzer, PharmD Clinical Pharmacist

## 2019-02-10 NOTE — Progress Notes (Signed)
Pt. requested bedpan. After turning on bedpan, pt began labor and pursed lip breathing, HR 130-140s, using accessory muscles, complained of "hard to breathe". Nelda Marseille, MD was paged. Chest-xray was ordered and obtained. CCM came to bedside and intubated pt at 1750h.  Romie Tay 02/10/2019 1750

## 2019-02-10 NOTE — Progress Notes (Signed)
FiO2 dropped to 40% and PEEP decreased to 8 cmh20 per ABG results. Pt tolerating well.

## 2019-02-11 ENCOUNTER — Encounter (HOSPITAL_COMMUNITY): Payer: Self-pay

## 2019-02-11 ENCOUNTER — Inpatient Hospital Stay (HOSPITAL_COMMUNITY): Payer: Medicare Other

## 2019-02-11 ENCOUNTER — Other Ambulatory Visit: Payer: Self-pay

## 2019-02-11 DIAGNOSIS — R570 Cardiogenic shock: Secondary | ICD-10-CM

## 2019-02-11 LAB — BASIC METABOLIC PANEL
Anion gap: 11 (ref 5–15)
BUN: 25 mg/dL — ABNORMAL HIGH (ref 8–23)
CO2: 25 mmol/L (ref 22–32)
Calcium: 8.1 mg/dL — ABNORMAL LOW (ref 8.9–10.3)
Chloride: 101 mmol/L (ref 98–111)
Creatinine, Ser: 1.16 mg/dL (ref 0.61–1.24)
GFR calc Af Amer: >60 mL/min (ref >60)
GFR calc non Af Amer: >60 mL/min (ref >60)
Glucose, Bld: 143 mg/dL — ABNORMAL HIGH (ref 70–99)
Potassium: 3.6 mmol/L (ref 3.5–5.1)
Sodium: 137 mmol/L (ref 135–145)

## 2019-02-11 LAB — COOXEMETRY PANEL
Carboxyhemoglobin: 1.4 % (ref 0.5–1.5)
Methemoglobin: 0.9 % (ref 0.0–1.5)
O2 Saturation: 56.3 %
Total hemoglobin: 12.5 g/dL (ref 12.0–16.0)

## 2019-02-11 LAB — COMPREHENSIVE METABOLIC PANEL
ALT: 102 U/L — ABNORMAL HIGH (ref 0–44)
AST: 134 U/L — ABNORMAL HIGH (ref 15–41)
Albumin: 2.6 g/dL — ABNORMAL LOW (ref 3.5–5.0)
Alkaline Phosphatase: 56 U/L (ref 38–126)
Anion gap: 11 (ref 5–15)
BUN: 26 mg/dL — ABNORMAL HIGH (ref 8–23)
CO2: 26 mmol/L (ref 22–32)
Calcium: 8.1 mg/dL — ABNORMAL LOW (ref 8.9–10.3)
Chloride: 100 mmol/L (ref 98–111)
Creatinine, Ser: 1.13 mg/dL (ref 0.61–1.24)
GFR calc Af Amer: >60 mL/min (ref >60)
GFR calc non Af Amer: >60 mL/min (ref >60)
Glucose, Bld: 136 mg/dL — ABNORMAL HIGH (ref 70–99)
Potassium: 3.6 mmol/L (ref 3.5–5.1)
Sodium: 137 mmol/L (ref 135–145)
Total Bilirubin: 0.8 mg/dL (ref 0.3–1.2)
Total Protein: 5.1 g/dL — ABNORMAL LOW (ref 6.5–8.1)

## 2019-02-11 LAB — CBC
HCT: 34.1 % — ABNORMAL LOW (ref 39.0–52.0)
Hemoglobin: 11.6 g/dL — ABNORMAL LOW (ref 13.0–17.0)
MCH: 30.9 pg (ref 26.0–34.0)
MCHC: 34 g/dL (ref 30.0–36.0)
MCV: 90.9 fL (ref 80.0–100.0)
Platelets: 256 10*3/uL (ref 150–400)
RBC: 3.75 MIL/uL — ABNORMAL LOW (ref 4.22–5.81)
RDW: 13.8 % (ref 11.5–15.5)
WBC: 15.3 10*3/uL — ABNORMAL HIGH (ref 4.0–10.5)
nRBC: 0 % (ref 0.0–0.2)

## 2019-02-11 LAB — POCT I-STAT 7, (LYTES, BLD GAS, ICA,H+H)
Acid-Base Excess: 2 mmol/L (ref 0.0–2.0)
Bicarbonate: 25 mmol/L (ref 20.0–28.0)
Calcium, Ion: 1.12 mmol/L — ABNORMAL LOW (ref 1.15–1.40)
HCT: 34 % — ABNORMAL LOW (ref 39.0–52.0)
Hemoglobin: 11.6 g/dL — ABNORMAL LOW (ref 13.0–17.0)
O2 Saturation: 96 %
Potassium: 3.6 mmol/L (ref 3.5–5.1)
Sodium: 137 mmol/L (ref 135–145)
TCO2: 26 mmol/L (ref 22–32)
pCO2 arterial: 31.2 mmHg — ABNORMAL LOW (ref 32.0–48.0)
pH, Arterial: 7.512 — ABNORMAL HIGH (ref 7.350–7.450)
pO2, Arterial: 74 mmHg — ABNORMAL LOW (ref 83.0–108.0)

## 2019-02-11 LAB — HEPATITIS PANEL, ACUTE
HCV Ab: <0.1 s/co ratio (ref 0.0–0.9)
Hep A IgM: NEGATIVE
Hep B C IgM: NEGATIVE
Hepatitis B Surface Ag: NEGATIVE

## 2019-02-11 LAB — MAGNESIUM
Magnesium: 1.9 mg/dL (ref 1.7–2.4)
Magnesium: 2 mg/dL (ref 1.7–2.4)

## 2019-02-11 LAB — PHOSPHORUS
Phosphorus: 1.6 mg/dL — ABNORMAL LOW (ref 2.5–4.6)
Phosphorus: 2.8 mg/dL (ref 2.5–4.6)

## 2019-02-11 LAB — PLATELET INHIBITION P2Y12: Platelet Function  P2Y12: 277 [PRU] (ref 182–335)

## 2019-02-11 LAB — CULTURE, RESPIRATORY W GRAM STAIN
Culture: NORMAL
Special Requests: NORMAL

## 2019-02-11 LAB — TRIGLYCERIDES: Triglycerides: 85 mg/dL (ref <150)

## 2019-02-11 LAB — HEPARIN LEVEL (UNFRACTIONATED): Heparin Unfractionated: 0.23 IU/mL — ABNORMAL LOW (ref 0.30–0.70)

## 2019-02-11 MED ORDER — POTASSIUM CHLORIDE 10 MEQ/50ML IV SOLN
10.0000 meq | INTRAVENOUS | Status: AC
  Administered 2019-02-11 (×4): 10 meq via INTRAVENOUS
  Filled 2019-02-11 (×4): qty 50

## 2019-02-11 MED ORDER — SPIRONOLACTONE 12.5 MG HALF TABLET
12.5000 mg | ORAL_TABLET | Freq: Every day | ORAL | Status: DC
  Administered 2019-02-12 – 2019-02-13 (×2): 12.5 mg
  Filled 2019-02-11 (×2): qty 1

## 2019-02-11 MED ORDER — PREDNISONE 20 MG PO TABS
40.0000 mg | ORAL_TABLET | Freq: Every day | ORAL | Status: AC
  Administered 2019-02-12 – 2019-02-13 (×2): 40 mg
  Filled 2019-02-11 (×2): qty 2

## 2019-02-11 MED ORDER — CITALOPRAM HYDROBROMIDE 20 MG PO TABS
20.0000 mg | ORAL_TABLET | Freq: Every day | ORAL | Status: DC
  Administered 2019-02-12 – 2019-02-13 (×2): 20 mg
  Filled 2019-02-11 (×2): qty 1

## 2019-02-11 MED ORDER — ACETAMINOPHEN 325 MG PO TABS: 650.0000 mg | ORAL_TABLET | ORAL | Status: DC | PRN

## 2019-02-11 MED ORDER — POTASSIUM CHLORIDE 20 MEQ/15ML (10%) PO SOLN
40.0000 meq | Freq: Three times a day (TID) | ORAL | Status: AC
  Administered 2019-02-11 (×2): 40 meq
  Filled 2019-02-11 (×2): qty 30

## 2019-02-11 MED ORDER — METOLAZONE 5 MG PO TABS
5.0000 mg | ORAL_TABLET | Freq: Every day | ORAL | Status: AC
  Administered 2019-02-11: 12:00:00 5 mg via ORAL
  Filled 2019-02-11: qty 1

## 2019-02-11 MED ORDER — ASPIRIN 81 MG PO CHEW
81.0000 mg | CHEWABLE_TABLET | Freq: Every day | ORAL | Status: DC
  Administered 2019-02-12 – 2019-02-13 (×2): 81 mg
  Filled 2019-02-11 (×2): qty 1

## 2019-02-11 MED ORDER — BUPROPION HCL 100 MG PO TABS
50.0000 mg | ORAL_TABLET | Freq: Three times a day (TID) | ORAL | Status: DC
  Administered 2019-02-12 – 2019-02-13 (×6): 50 mg
  Filled 2019-02-11 (×8): qty 0.5

## 2019-02-11 MED ORDER — IPRATROPIUM-ALBUTEROL 0.5-2.5 (3) MG/3ML IN SOLN
3.0000 mL | Freq: Three times a day (TID) | RESPIRATORY_TRACT | Status: DC
  Administered 2019-02-11 – 2019-02-13 (×7): 3 mL via RESPIRATORY_TRACT
  Filled 2019-02-11 (×7): qty 3

## 2019-02-11 MED ORDER — POTASSIUM CHLORIDE 20 MEQ/15ML (10%) PO SOLN
20.0000 meq | ORAL | Status: AC
  Administered 2019-02-11 (×2): 20 meq
  Filled 2019-02-11 (×2): qty 15

## 2019-02-11 MED ORDER — CHLORHEXIDINE GLUCONATE CLOTH 2 % EX PADS
6.0000 | MEDICATED_PAD | Freq: Every day | CUTANEOUS | Status: DC
  Administered 2019-02-11 – 2019-02-13 (×3): 6 via TOPICAL

## 2019-02-11 MED ORDER — MAGNESIUM SULFATE 2 GM/50ML IV SOLN
2.0000 g | Freq: Once | INTRAVENOUS | Status: AC
  Administered 2019-02-11: 2 g via INTRAVENOUS
  Filled 2019-02-11: qty 50

## 2019-02-11 MED ORDER — FUROSEMIDE 10 MG/ML IJ SOLN
40.0000 mg | Freq: Four times a day (QID) | INTRAMUSCULAR | Status: AC
  Administered 2019-02-11 (×3): 40 mg via INTRAVENOUS
  Filled 2019-02-11 (×3): qty 4

## 2019-02-11 MED ORDER — DIGOXIN 125 MCG PO TABS
0.1250 mg | ORAL_TABLET | Freq: Every day | ORAL | Status: DC
  Administered 2019-02-12 – 2019-02-13 (×2): 0.125 mg
  Filled 2019-02-11 (×2): qty 1

## 2019-02-11 MED ORDER — SODIUM PHOSPHATES 45 MMOLE/15ML IV SOLN
30.0000 mmol | Freq: Once | INTRAVENOUS | Status: AC
  Administered 2019-02-11: 30 mmol via INTRAVENOUS
  Filled 2019-02-11: qty 10

## 2019-02-11 MED ORDER — ATORVASTATIN CALCIUM 80 MG PO TABS
80.0000 mg | ORAL_TABLET | Freq: Every day | ORAL | Status: DC
  Administered 2019-02-11 – 2019-02-13 (×3): 80 mg
  Filled 2019-02-11 (×3): qty 1

## 2019-02-11 MED ORDER — PANTOPRAZOLE SODIUM 40 MG PO PACK: 40.0000 mg | PACK | Freq: Every day | ORAL | Status: DC

## 2019-02-11 MED FILL — Nitroglycerin IV Soln 100 MCG/ML in D5W: INTRA_ARTERIAL | Qty: 10 | Status: AC

## 2019-02-11 NOTE — Progress Notes (Signed)
Dr. Haroldine Laws paged regarding coox of 56. Orders for re-check coox in AM ordered.  RN will continue to monitor.

## 2019-02-11 NOTE — Progress Notes (Signed)
Assisted tele visit to patient with wife and son.  Lui Bellis M Safiya Girdler, RN   

## 2019-02-11 NOTE — Progress Notes (Signed)
NAME:  Brent Carter, MRN:  [id number], DOB:  07-15-1951, LOS: 2 ADMISSION DATE:  02/07/2019, CONSULTATION DATE:  02/01/2019 REFERRING MD:  [facility], CHIEF COMPLAINT:  STEMI  Brief History   68 year old man 60+ packyear active smoker with history of COPD, hypertension, hyperlipidemia, peripheral vascular disease, diverticular disease, sleep apnea (not on CPAP), anxiety presented with dyspnea found to have acute hypoxic and hypercarbic respiratory failure, 4 min cardiac arrest, STEMI s/p TPA on 02/08/2019 at 2030 and transferred here from [place].  History of present illness   68 year old man 53+ packyear active smoker with history of COPD, hypertension, hyperlipidemia, peripheral vascular disease, diverticular disease, sleep apnea (not on CPAP), anxiety presenting with dyspnea found to have STEMI s/p alteplase on 02/08/2019 at 2030 at [place]. Per EMR, patient arrived via EMS with dyspnea and respiratory distress. His dyspnea has been increasing the past 3 weeks and became acutely worse prior to arrival. He was found to be hypoxic with O2 sat in the 80s. He was also found to be wheezing. He was placed on CPAP. He was emergently intubated. No fevers or chest pain.   In the ED, he was found to have ST elevation in AVR with ST depression diffusely otherwise. CXR with RLL opacity. ED discussed with cardiology here and due to the STEMI and possibility of COVID, he was given thrombolytics for his STEMI. COVID testing came back negative. He received alteplase, ASA 342m. He had resolution of the ST changes.  Per [facility] report, he had cardiac arrest and received 4 min of CPR and Epi 148m No defibrillation. Per EMR appears to have had it prior to the alteplase.   He was seen by Dr. MaVaughan Browner/15/2020 for COPD and recently [facility].  Past Medical History  COPD, hypertension, hyperlipidemia, peripheral vascular disease, diverticular disease, sleep apnea (not on CPAP), anxiety   Significant Hospital Events   6/12>> Presented to ED 6/13>> [facility]red [facility] 6/13>  Procedures:  CVC 6/13>  Significant Diagnostic Tests:  CXR 02/08/2019: asymmetric airspace disease in RLL, hyperinflation. No pneumothorax  EKG 02/08/2019: ST elevation in AVR, ST depression diffusely  OSH labs 02/08/2019 WBC 14.4, Hgb 15.4, Plt 295, ANC 9.22 DDimer 6403 PT 11.3, INR 1.1 APTT 23.3 (_0 ) ABG 7.29/53/406 Na 135, K 4.7, CO2 21, cr 0.9, BUN 15 Troponin 1.32 (_1 ) NT-proBNP 4680 T bili 1, AST 303, ALT 201, alk phos 107 Procal 0.05 SARS-CoV-2 RNA not detected  Micro Data:  BCx 6/13> Tracheal aspirate 6/13>  Antimicrobials:  Ceftriaxone 6/12> Azithromycin 6/12>  Interim history/subjective:  Weaning well this AM  Objective   Blood pressure (!) 97/49, pulse 65, temperature 97.7 F (36.5 C), temperature source Axillary, resp. rate 18, height _2  (1.651 m), weight 92.9 kg, SpO2 98 %. CVP:  [11 mmHg-31 mmHg] 11 mmHg  Vent Mode: PRVC FiO2 (%):  [40 %-100 %] 40 % Set Rate:  [18 bmp-24 bmp] 18 bmp Vt Set:  [490 mL] 490 mL PEEP:  [5 cmH20-8 cmH20] 5 cmH20 Plateau Pressure:  [18 cmH20-24 cmH20] 18 cmH20   Intake/Output Summary (Last 24 hours) at 02/11/2019 1105 Last data filed at 02/11/2019 0800 Gross per 24 hour  Intake 2494.46 ml  Output 2265 ml  Net 229.46 ml   Filed Weights   02/22/2019 0355 02/10/19 0349 02/11/19 0500  Weight: 97.7 kg 95.5 kg 92.9 kg   Examination: General: Well appearing, NAD HENT: [place]/AT, PERRL, EOM-I and MMM, ETT in place  Lungs: Decreased BS diffusely  Cardiovascular: RRR, Nl S1/S2 and -M/R/G Abdomen: Soft, NT, ND and +BS Extremities: Diffuse erythema but no edema or tenderness  Neuro: Of sedation, arousable and following commands GU: Foley in place  Assessment & Plan:  68 year old man 70+ packyear active smoker with history of COPD, hypertension, hyperlipidemia, peripheral vascular disease,  diverticular disease, sleep apnea (not on CPAP), anxiety presented with dyspnea found to have acute hypoxic and hypercarbic respiratory failure, 4 min cardiac arrest, STEMI s/p TPA on 02/08/2019 at 2030 and transferred here from [place].  Acute hypoxic and hypercarbic respiratory failure:  - PS as tolerated today - Hold off extubation today - Diureses as BP allows - Titrate O2 for sat of 90-95% - BD as ordered  CAP, COPD exacerbation: - Brovana, Pulmicort neb BID - Duonebs scheduled and albprn wheezing/dyspnea - Continue ceftriaxone for 5 days and azithromycin 568m for 3 days - Prednisone 448mx 5 days  STEMI/CAD, cardiac arrest: - Cardiology consulted, will be taken to the cath lab today - Heparin gtt, asa, plavix - Hold off on statin for now as he has some transaminitis - TTE with very low EF - Lipid panel, A1c - Active diureses - Maintain as dry as able - CABG once extubated  Shock, septic versus cardiogenic, transaminitis, hx HTN:  - Obtain venous O2 sat from central line - 86%, no need for dobutamine - D/C levophed - Lasix 40 mg IV q6 x3 doses - Replace K, Mg and Phos - Hold home antihypertensives  Anxiety: - Continue home Wellbutrin, citalopram.  - Continue home Klonopin at half dose to prevent withdrawal  - D/C fentanyl, PRN versed  Best practice:  Diet: NPO Pain/Anxiety/Delirium protocol (if indicated): Fent gtt and prn, discontinued versed gtt and can start propofol if needed for goal RASS 0 VAP protocol (if indicated): PPI daily DVT prophylaxis: hep gtt GI prophylaxis: PPI daily Glucose control: Monitor Mobility: OOB when able Code Status: FULL Family Communication: Updated wife and son via phone Disposition: ICU  Labs   CBC: Recent Labs  Lab 02/22/2019 0434  02/10/19 0354  02/10/19 1130 02/10/19 1559 02/10/19 1827 02/11/19 0256 02/11/19 0305  WBC 18.0*  --  16.1*  --   --   --   --   --  15.3*  NEUTROABS 16.1*  --   --   --   --   --   --   --    --   HGB 14.0   < > 11.4*   < > 12.6* 12.6* 11.9* 11.6* 11.6*  HCT 43.0   < > 33.8*   < > 37.0* 37.0* 35.0* 34.0* 34.1*  MCV 93.5  --  89.9  --   --   --   --   --  90.9  PLT 270  --  239  --   --   --   --   --  256   < > = values in this interval not displayed.   Basic Metabolic Panel: Recent Labs  Lab 01/31/2019 0434  02/10/19 0354  02/10/19 1559 02/10/19 1827 02/10/19 2352 02/11/19 0256 02/11/19 0305  NA 134*   < > 135   < > 136 135 137 137 137  K 4.3   < > 2.8*   < > 4.6 4.3 3.6 3.6 3.6  CL 100  --  101  --   --   --  101  --  100  CO2 22  --  24  --   --   --  25  --  26  GLUCOSE 143*  --  139*  --   --   --  143*  --  136*  BUN 21  --  21  --   --   --  25*  --  26*  CREATININE 1.51*  --  1.07  --   --   --  1.16  --  1.13  CALCIUM 8.4*  --  8.0*  --   --   --  8.1*  --  8.1*  MG 2.0  --  1.9  --   --   --  2.0  --  1.9  PHOS 4.5  --  1.4*  --   --   --   --   --  1.6*   < > = values in this interval not displayed.   Recent Labs  Lab 02/16/2019 0434 02/10/19 0354 02/11/19 0305  WBC 18.0* 16.1* 15.3*  LATICACIDVEN 1.2  --   --    ABG    Component Value Date/Time   PHART 7.512 (H) 02/11/2019 0256   PCO2ART 31.2 (L) 02/11/2019 0256   PO2ART 74.0 (L) 02/11/2019 0256   HCO3 25.0 02/11/2019 0256   TCO2 26 02/11/2019 0256   ACIDBASEDEF 1.0 02/10/2019 1827   O2SAT 96.0 02/11/2019 0256   The patient is critically ill with multiple organ systems failure and requires high complexity decision making for assessment and support, frequent evaluation and titration of therapies, application of advanced monitoring technologies and extensive interpretation of multiple databases.   Critical Care Time devoted to patient care services described in this note is  32  Minutes. This time reflects time of care of this signee Dr Jennet Maduro. This critical care time does not reflect procedure time, or teaching time or supervisory time of PA/NP/Med student/Med Resident etc but could involve  care discussion time.  Rush Farmer, M.D. [facility] Pulmonary/Critical Care Medicine. Pager: [phone]. After hours pager: [phone].

## 2019-02-11 NOTE — Progress Notes (Signed)
Patient ID: Brent Carter, male   DOB: 1950-09-13, 68 y.o.   MRN: [id number]     Advanced Heart Failure Rounding Note  PCP-Cardiologist: No primary care provider on file.   Subjective:    Patient received tPA at [facility] on 6/1. Cath on 6/13 with severe 3v CAD including 90% ostial LAD. Echo EF 30-35% LVEDP 15  Has been seen by TCTS. Pending CABG.   Extubated yesterday and had to be re-intubated. Now awake on vent FiO2 40%. Eager to get tube out. Diuresing well with IV lasix. CVP 9-10. On NE @ 3. Co-ox 56%.   CXR with diffuse edema. Personally reviewed  Objective:   Weight Range: 92.9 kg Body mass index is 34.08 kg/m.   Vital Signs:   Temp:  [97.7 F (36.5 C)-98.7 F (37.1 C)] 98.1 F (36.7 C) (06/15 1615) Pulse Rate:  [63-115] 69 (06/15 1630) Resp:  [16-24] 18 (06/15 1630) BP: (72-156)/(43-120) 116/58 (06/15 1630) SpO2:  [95 %-100 %] 99 % (06/15 1630) FiO2 (%):  [40 %-100 %] 40 % (06/15 1607) Weight:  [92.9 kg] 92.9 kg (06/15 0500) Last BM Date: (PTA)  Weight change: Filed Weights   02/03/2019 0355 02/10/19 0349 02/11/19 0500  Weight: 97.7 kg 95.5 kg 92.9 kg    Intake/Output:   Intake/Output Summary (Last 24 hours) at 02/11/2019 1707 Last data filed at 02/11/2019 1600 Gross per 24 hour  Intake 3543.43 ml  Output 2490 ml  Net 1053.43 ml      Physical Exam    General:  Awake on vent. Follows commands.  HEENT: normal + ETT Neck: supple. JVP to jaw. Carotids 2+ bilat; no bruits. No lymphadenopathy or thryomegaly appreciated. Cor: PMI nondisplaced. Regular rate & rhythm. No rubs, gallops or murmurs. Lungs: coarse Abdomen: soft, nontender, nondistended. No hepatosplenomegaly. No bruits or masses. Good bowel sounds. Extremities: no cyanosis, clubbing, rash, tr edema diffuse ecchymosis Neuro:  Awake on vent. Follows commands.    Telemetry   Sinus 60-70s Personally reviewed  EKG    NSR, anteroseptal MI (Qs, slight STE), slight STE AVL  Labs    CBC Recent  Labs    02/25/2019 0434  02/10/19 0354  02/11/19 0256 02/11/19 0305  WBC 18.0*  --  16.1*  --   --  15.3*  NEUTROABS 16.1*  --   --   --   --   --   HGB 14.0   < > 11.4*   < > 11.6* 11.6*  HCT 43.0   < > 33.8*   < > 34.0* 34.1*  MCV 93.5  --  89.9  --   --  90.9  PLT 270  --  239  --   --  256   < > = values in this interval not displayed.   Basic Metabolic Panel Recent Labs    02/10/19 0354  02/10/19 2352 02/11/19 0256 02/11/19 0305  NA 135   < > 137 137 137  K 2.8*   < > 3.6 3.6 3.6  CL 101  --  101  --  100  CO2 24  --  25  --  26  GLUCOSE 139*  --  143*  --  136*  BUN 21  --  25*  --  26*  CREATININE 1.07  --  1.16  --  1.13  CALCIUM 8.0*  --  8.1*  --  8.1*  MG 1.9  --  2.0  --  1.9  PHOS 1.4*  --   --   --  1.6*   < > = values in this interval not displayed.   Liver Function Tests Recent Labs    02/10/19 0354 02/11/19 0305  AST 99* 134*  ALT 115* 102*  ALKPHOS 61 56  BILITOT 0.5 0.8  PROT 4.9* 5.1*  ALBUMIN 2.5* 2.6*   No results for input(s): LIPASE, AMYLASE in the last 72 hours. Cardiac Enzymes Recent Labs    02/23/2019 0434 02/19/2019 1639 02/04/2019 2258  TROPONINI 42.10* 54.52* 29.52*    BNP: BNP (last 3 results) Recent Labs    02/25/2019 0434  BNP 1,279.0*    ProBNP (last 3 results) No results for input(s): PROBNP in the last 8760 hours.   D-Dimer No results for input(s): DDIMER in the last 72 hours. Hemoglobin A1C Recent Labs    02/08/2019 0638  HGBA1C 5.6   Fasting Lipid Panel Recent Labs    02/05/2019 0638  02/11/19 0305  CHOL 115  --   --   HDL 36*  --   --   LDLCALC 71  --   --   TRIG 42   < > 85  CHOLHDL 3.2  --   --    < > = values in this interval not displayed.   Thyroid Function Tests No results for input(s): TSH, T4TOTAL, T3FREE, THYROIDAB in the last 72 hours.  Invalid input(s): FREET3  Other results:   Imaging    Dg Chest 1 View  Result Date: 02/10/2019 CLINICAL DATA:  Respiratory distress EXAM: CHEST  1  VIEW COMPARISON:  02/10/19 FINDINGS: Interval extubation. Central line remains. Stable cardiac silhouette. Diffuse bilateral airspace disease greater on the RIGHT is unchanged. No new atelectasis. IMPRESSION: 1. Extubation without increased atelectasis. 2. Diffuse bilateral airspace disease not improved. Worse on the RIGHT. Electronically Signed   By: Suzy Bouchard M.D.   On: 02/10/2019 18:05   Dg Chest Port 1 View  Result Date: 02/11/2019 CLINICAL DATA:  Respiratory failure. EXAM: PORTABLE CHEST 1 VIEW COMPARISON:  02/10/2019. FINDINGS: Endotracheal tube, NG tube, right IJ line in stable position. Stable cardiomegaly. Diffuse bilateral pulmonary infiltrates/edema again noted. Stable bilateral pleural effusions. No pneumothorax. Degenerative change thoracic spine. IMPRESSION: 1.  Lines and tubes stable position. 2. Stable cardiomegaly. Diffuse bilateral pulmonary infiltrates/edema again noted. No significant interim change. Stable bilateral pleural effusions. Electronically Signed   By: Marcello Moores  Register   On: 02/11/2019 07:45   Dg Chest Port 1 View  Result Date: 02/10/2019 CLINICAL DATA:  Intubations, respiratory failure EXAM: PORTABLE CHEST 1 VIEW COMPARISON:  02/10/2019 FINDINGS: Endotracheal tube tip 8.3 cm above the carina, just below the thoracic inlet. Right internal jugular central venous catheter tip: Just into the SVC. Cervical plate and screw fixator. Atherosclerotic calcification of the aortic arch. Bibasilar airspace opacities right greater than left, similar to prior. Bilateral interstitial accentuation similar to prior. Mild left basilar airspace opacity, similar to prior. Mildly prominent main pulmonary artery. The costophrenic angles are excluded. Tapering of the peripheral pulmonary vasculature favors emphysema. IMPRESSION: 1. The endotracheal tube tip is just below the thoracic inlet, 8.3 cm above the carina. 2. Stable interstitial and airspace opacities potentially from bilateral  pneumonia or pulmonary edema. 3. Aortic Atherosclerosis (ICD10-I70.0) and Emphysema (ICD10-J43.9). Electronically Signed   By: Van Clines M.D.   On: 02/10/2019 18:23     Medications:     Scheduled Medications: . arformoterol  15 mcg Nebulization BID  . [START ON 02/12/2019] aspirin  81 mg Per Tube Daily  . atorvastatin  80 mg Per  Tube q1800  . budesonide (PULMICORT) nebulizer solution  0.25 mg Nebulization BID  . [START ON 02/12/2019] buPROPion  50 mg Per Tube TID  . chlorhexidine gluconate (MEDLINE KIT)  15 mL Mouth Rinse BID  . Chlorhexidine Gluconate Cloth  6 each Topical Daily  . [START ON 02/12/2019] citalopram  20 mg Per Tube Daily  . clonazePAM  0.25 mg Per Tube Daily  . [START ON 02/12/2019] digoxin  0.125 mg Per Tube Daily  . fentaNYL (SUBLIMAZE) injection  25 mcg Intravenous Once  . furosemide  40 mg Intravenous Q6H  . ipratropium-albuterol  3 mL Nebulization TID  . mouth rinse  15 mL Mouth Rinse 10 times per day  . [START ON 02/12/2019] pantoprazole sodium  40 mg Per Tube Q1200  . [START ON 02/12/2019] predniSONE  40 mg Per Tube Daily  . rocuronium  50 mg Intravenous Once  . sodium chloride flush  3 mL Intravenous Q12H  . [START ON 02/12/2019] spironolactone  12.5 mg Per Tube Daily    Infusions: . sodium chloride Stopped (02/10/19 0520)  . cefTRIAXone (ROCEPHIN)  IV Stopped (02/10/19 2103)  . dexmedetomidine (PRECEDEX) IV infusion 0.4 mcg/kg/hr (02/11/19 1600)  . doxycycline (VIBRAMYCIN) IV Stopped (02/11/19 1043)  . fentaNYL infusion INTRAVENOUS 75 mcg/hr (02/11/19 1600)  . heparin 1,500 Units/hr (02/11/19 1600)  . norepinephrine (LEVOPHED) Adult infusion 3 mcg/min (02/11/19 0700)    PRN Medications: sodium chloride, acetaminophen, albuterol, bisacodyl, docusate, fentaNYL, midazolam, ondansetron (ZOFRAN) IV, sodium chloride flush        Assessment/Plan   1. CAD: Anterior STEMI.  Patient presented to [facility] with STEMI on 6/12.  There was concern for  coronavirus and tPA was given while waiting for COVID-19 testing.  This was negative.  - Peak trop 54.5 -> 29.5 - Cath with 6/13 with 3v CAD. EF 30-35% - Continue heparin gtt, statin, ASA 81. Got 1 dose Plavix 75 on 6/13 - TCTS has seen for possible CABG. Will need to optimize hemodynamics and respiratory status first. Continue diuresis and treatment of PNA  2. Acute systolic CHF: Ischemic cardiomyopathy.  EF 30% - CVP 15 -> 9. Continue diuresis - Remains on NE 3. Co-ox 56%. Follow closely. Wean NE as tolerated - Continue dig and spiro.  - No b-blocker or ARNI yet while on NE  3. Acute hypoxemic respiratory failure:  PNA/COPD exacerbation. Suspect there is a component of pulmonary edema as well.  CXR with infiltrates on CXR (R>L base).   - On ceftriaxone/doxycycline - He is on prednisone per CCM.  - Extubated 6/14 but reintubated same day  - likely can try to wean again tomorrow after diuresis  4. COPD:  - Treating for AECOPD per CCM with abx, prednisone, nebs.   6. ?AKI: Creatinine 1.5, unsure of baseline. May have in setting of hypotension/cardiac arrest.  - Resolved. Creatinine 1.1today  7. Cardiac arrest:  - Prior to tPA, uncertain rhythm, had 4 minutes CPR + epinephrine, no shock. No apparent neurologic damage (alert, follows commands on vent).   8. Elevated LFTs:  - Likely related to hypoperfusion with arrest.   9. ABL anemia - hgb 14 -> 12 in setting of t-PA and bleeding from central line. Now improved. Will follow.   10. Hypokalemia/hypomagnesemia - supp  CRITICAL CARE Performed by: Glori Bickers  Total critical care time: 40 minutes  Critical care time was exclusive of separately billable procedures and treating other patients.  Critical care was necessary to treat or prevent imminent or life-threatening deterioration.  Critical care was time spent personally by me on the following activities: development of treatment plan with patient and/or surrogate as well  as nursing, discussions with consultants, evaluation of patient's response to treatment, examination of patient, obtaining history from patient or surrogate, ordering and performing treatments and interventions, ordering and review of laboratory studies, ordering and review of radiographic studies, pulse oximetry and re-evaluation of patient's condition.   Length of Stay: 2  Glori Bickers, MD  02/11/2019, 5:07 PM  Advanced Heart Failure Team Pager [phone] (M-F; 7a - 4p)  Please contact [facility] for night-coverage after hours (4p -7a ) and weekends on [email or web address]

## 2019-02-11 NOTE — Plan of Care (Signed)
  Problem: Cardiac: Goal: Ability to achieve and maintain adequate cardiopulmonary perfusion will improve Outcome: Progressing   Problem: Clinical Measurements: Goal: Ability to maintain clinical measurements within normal limits will improve Outcome: Progressing   Problem: Clinical Measurements: Goal: Will remain free from infection Outcome: Progressing

## 2019-02-11 NOTE — Progress Notes (Signed)
eLink Physician-Brief Progress Note Patient Name: TAIKI BUCKWALTER DOB: 1951-07-18 MRN: [id number]   Date of Service  02/11/2019  HPI/Events of Note  ABG 7.51/31/74 AC 24/490/40%5 PEEP Not breathing over set rate  eICU Interventions  Decrease rate to 18     Intervention Category Major Interventions: Acid-Base disturbance - evaluation and management  Judd Lien 02/11/2019, 4:52 AM

## 2019-02-11 NOTE — Progress Notes (Signed)
[place] for heparin Indication: chest pain/ACS  No Known Allergies  Patient Measurements: Height: 5\' 5"  (165.1 cm) Weight: 210 lb 8.6 oz (95.5 kg) IBW/kg (Calculated) : 61.5 Heparin Dosing Weight: 83.1  Vital Signs: Temp: 98.4 F (36.9 C) (06/15 0330) Temp Source: Axillary (06/15 0330) BP: 113/62 (06/15 0330) Pulse Rate: 71 (06/15 0330)  Labs: Recent Labs    02/02/2019 0434  02/02/2019 1639 02/13/2019 2258 02/10/19 0354  02/10/19 0804  02/10/19 1557  02/10/19 1827 02/10/19 2352 02/11/19 0256 02/11/19 0305  HGB 14.0   < >  --   --  11.4*   < >  --    < >  --    < > 11.9*  --  11.6* 11.6*  HCT 43.0   < >  --   --  33.8*   < >  --    < >  --    < > 35.0*  --  34.0* 34.1*  PLT 270  --   --   --  239  --   --   --   --   --   --   --   --  256  APTT 54*  --   --   --   --   --   --   --   --   --   --   --   --   --   LABPROT 16.2*  --   --   --   --   --   --   --   --   --   --   --   --   --   INR 1.3*  --   --   --   --   --   --   --   --   --   --   --   --   --   HEPARINUNFRC 0.18*  --   --  <0.10*  --   --  0.21*  --  0.49  --   --   --   --  0.23*  CREATININE 1.51*  --   --   --  1.07  --   --   --   --   --   --  1.16  --   --   TROPONINI 42.10*  --  54.52* 29.52*  --   --   --   --   --   --   --   --   --   --    < > = values in this interval not displayed.    Estimated Creatinine Clearance: 63.8 mL/min (by C-G formula based on SCr of 1.16 mg/dL).   Medical History: Past Medical History:  Diagnosis Date  . Anxiety   . COPD (chronic obstructive pulmonary disease) ([place])   . Hypertension   . OSA (obstructive sleep apnea)      Assessment: 20 yoM transferred from [place] with STEMI s/p tPA. Pt started on heparin and then taken to the cath lab today revealing 3-vessel CAD. Heparin restarted 6hr after sheath pull.   Heparin level this am 0.23 units/ml.  No issues noted with infusion  Goal of Therapy:  Heparin level  0.3-0.7 units/ml Monitor platelets by anticoagulation protocol: Yes   Plan:  Increase heparin to 1500 units/hr Daily heparin level and CBC   Thanks for allowing pharmacy to be a part of this patient's care.  Excell Seltzer, PharmD Clinical Pharmacist

## 2019-02-11 NOTE — Progress Notes (Signed)
RR dropped from 24 to 18 per MD order.

## 2019-02-12 ENCOUNTER — Inpatient Hospital Stay (HOSPITAL_COMMUNITY): Payer: Medicare Other

## 2019-02-12 LAB — POCT I-STAT 7, (LYTES, BLD GAS, ICA,H+H)
Acid-Base Excess: 11 mmol/L — ABNORMAL HIGH (ref 0.0–2.0)
Bicarbonate: 34.6 mmol/L — ABNORMAL HIGH (ref 20.0–28.0)
Calcium, Ion: 1.17 mmol/L (ref 1.15–1.40)
HCT: 34 % — ABNORMAL LOW (ref 39.0–52.0)
Hemoglobin: 11.6 g/dL — ABNORMAL LOW (ref 13.0–17.0)
O2 Saturation: 98 %
Patient temperature: 97.8
Potassium: 3.3 mmol/L — ABNORMAL LOW (ref 3.5–5.1)
Sodium: 135 mmol/L (ref 135–145)
TCO2: 36 mmol/L — ABNORMAL HIGH (ref 22–32)
pCO2 arterial: 41 mmHg (ref 32.0–48.0)
pH, Arterial: 7.534 — ABNORMAL HIGH (ref 7.350–7.450)
pO2, Arterial: 95 mmHg (ref 83.0–108.0)

## 2019-02-12 LAB — CBC
HCT: 35.7 % — ABNORMAL LOW (ref 39.0–52.0)
Hemoglobin: 11.9 g/dL — ABNORMAL LOW (ref 13.0–17.0)
MCH: 30.6 pg (ref 26.0–34.0)
MCHC: 33.3 g/dL (ref 30.0–36.0)
MCV: 91.8 fL (ref 80.0–100.0)
Platelets: 282 10*3/uL (ref 150–400)
RBC: 3.89 MIL/uL — ABNORMAL LOW (ref 4.22–5.81)
RDW: 14.2 % (ref 11.5–15.5)
WBC: 18.6 10*3/uL — ABNORMAL HIGH (ref 4.0–10.5)
nRBC: 0.1 % (ref 0.0–0.2)

## 2019-02-12 LAB — COMPREHENSIVE METABOLIC PANEL
ALT: 92 U/L — ABNORMAL HIGH (ref 0–44)
AST: 73 U/L — ABNORMAL HIGH (ref 15–41)
Albumin: 3 g/dL — ABNORMAL LOW (ref 3.5–5.0)
Alkaline Phosphatase: 58 U/L (ref 38–126)
Anion gap: 11 (ref 5–15)
BUN: 31 mg/dL — ABNORMAL HIGH (ref 8–23)
CO2: 31 mmol/L (ref 22–32)
Calcium: 8.9 mg/dL (ref 8.9–10.3)
Chloride: 95 mmol/L — ABNORMAL LOW (ref 98–111)
Creatinine, Ser: 1.04 mg/dL (ref 0.61–1.24)
GFR calc Af Amer: >60 mL/min (ref >60)
GFR calc non Af Amer: >60 mL/min (ref >60)
Glucose, Bld: 122 mg/dL — ABNORMAL HIGH (ref 70–99)
Potassium: 3.5 mmol/L (ref 3.5–5.1)
Sodium: 137 mmol/L (ref 135–145)
Total Bilirubin: 0.8 mg/dL (ref 0.3–1.2)
Total Protein: 5.5 g/dL — ABNORMAL LOW (ref 6.5–8.1)

## 2019-02-12 LAB — HEPARIN LEVEL (UNFRACTIONATED)
Heparin Unfractionated: <0.1 IU/mL — ABNORMAL LOW (ref 0.30–0.70)
Heparin Unfractionated: 0.19 IU/mL — ABNORMAL LOW (ref 0.30–0.70)
Heparin Unfractionated: 1.9 IU/mL — ABNORMAL HIGH (ref 0.30–0.70)
Heparin Unfractionated: 1.22 IU/mL — ABNORMAL HIGH (ref 0.30–0.70)

## 2019-02-12 LAB — TRIGLYCERIDES: Triglycerides: 106 mg/dL (ref <150)

## 2019-02-12 LAB — COOXEMETRY PANEL
Carboxyhemoglobin: 1.6 % — ABNORMAL HIGH (ref 0.5–1.5)
Methemoglobin: 0.9 % (ref 0.0–1.5)
O2 Saturation: 61.2 %
Total hemoglobin: 12.5 g/dL (ref 12.0–16.0)

## 2019-02-12 LAB — PHOSPHORUS: Phosphorus: 2.7 mg/dL (ref 2.5–4.6)

## 2019-02-12 LAB — MAGNESIUM: Magnesium: 2.3 mg/dL (ref 1.7–2.4)

## 2019-02-12 MED ORDER — ORAL CARE MOUTH RINSE
15.0000 mL | Freq: Two times a day (BID) | OROMUCOSAL | Status: DC
  Administered 2019-02-12: 15 mL via OROMUCOSAL

## 2019-02-12 MED ORDER — PANTOPRAZOLE SODIUM 40 MG IV SOLR
40.0000 mg | INTRAVENOUS | Status: DC
  Administered 2019-02-12 – 2019-02-13 (×2): 40 mg via INTRAVENOUS
  Filled 2019-02-12 (×2): qty 40

## 2019-02-12 MED ORDER — POTASSIUM CHLORIDE 20 MEQ/15ML (10%) PO SOLN: 40.0000 meq | Freq: Once | ORAL | Status: DC

## 2019-02-12 MED ORDER — FUROSEMIDE 10 MG/ML IJ SOLN
40.0000 mg | Freq: Once | INTRAMUSCULAR | Status: AC
  Administered 2019-02-12: 40 mg via INTRAVENOUS
  Filled 2019-02-12: qty 4

## 2019-02-12 MED ORDER — POTASSIUM CHLORIDE 20 MEQ/15ML (10%) PO SOLN
40.0000 meq | Freq: Once | ORAL | Status: DC
  Filled 2019-02-12: qty 30

## 2019-02-12 MED ORDER — POTASSIUM CHLORIDE 10 MEQ/50ML IV SOLN
10.0000 meq | INTRAVENOUS | Status: AC
  Administered 2019-02-12 (×2): 10 meq via INTRAVENOUS
  Filled 2019-02-12 (×2): qty 50

## 2019-02-12 MED ORDER — POTASSIUM PHOSPHATES 15 MMOLE/5ML IV SOLN
30.0000 mmol | Freq: Once | INTRAVENOUS | Status: AC
  Administered 2019-02-12: 30 mmol via INTRAVENOUS
  Filled 2019-02-12: qty 10

## 2019-02-12 NOTE — Progress Notes (Signed)
NAME:  Brent Carter, MRN:  [id number], DOB:  Jan 15, 1951, LOS: 3 ADMISSION DATE:  01/28/2019, CONSULTATION DATE:  02/12/2019 REFERRING MD:  [facility], CHIEF COMPLAINT:  STEMI  Brief History   68 year old man 19+ packyear active smoker with history of COPD, hypertension, hyperlipidemia, peripheral vascular disease, diverticular disease, sleep apnea (not on CPAP), anxiety presented with dyspnea found to have acute hypoxic and hypercarbic respiratory failure, 4 min cardiac arrest, STEMI s/p TPA on 02/08/2019 at 2030 and transferred here from [place].  History of present illness   68 year old man 54+ packyear active smoker with history of COPD, hypertension, hyperlipidemia, peripheral vascular disease, diverticular disease, sleep apnea (not on CPAP), anxiety presenting with dyspnea found to have STEMI s/p alteplase on 02/08/2019 at 2030 at [facility]. Per EMR, patient arrived via EMS with dyspnea and respiratory distress. His dyspnea has been increasing the past 3 weeks and became acutely worse prior to arrival. He was found to be hypoxic with O2 sat in the 80s. He was also found to be wheezing. He was placed on CPAP. He was emergently intubated. No fevers or chest pain.   In the ED, he was found to have ST elevation in AVR with ST depression diffusely otherwise. CXR with RLL opacity. ED discussed with cardiology here and due to the STEMI and possibility of COVID, he was given thrombolytics for his STEMI. COVID testing came back negative. He received alteplase, ASA 363m. He had resolution of the ST changes.  Per [facility] report, he had cardiac arrest and received 4 min of CPR and Epi 174m No defibrillation. Per EMR appears to have had it prior to the alteplase.   He was seen by Dr. MaVaughan Browner/15/2020 for COPD and [facility]  Past Medical History  COPD, hypertension, hyperlipidemia, peripheral vascular disease, diverticular disease, sleep apnea (not on CPAP), anxiety   Significant Hospital Events   6/12>> Presented to ED 6/13>> Transferred from [facility] 6/13>  Procedures:  CVC 6/13>  Significant Diagnostic Tests:  CXR 02/06/2019: asymmetric airspace disease in RLL, hyperinflation. No pneumothorax  EKG 02/08/2019: ST elevation in AVR, ST depression diffusely  OSH labs 02/08/2019 WBC 14.4, Hgb 15.4, Plt 295, ANC 9.22 DDimer 6403 PT 11.3, INR 1.1 APTT 23.3 (@2015 ) ABG 7.29/53/406 Na 135, K 4.7, CO2 21, cr 0.9, BUN 15 Troponin 1.32 (@1906 ) NT-proBNP 4680 T bili 1, AST 303, ALT 201, alk phos 107 Procal 0.05 SARS-CoV-2 RNA not detected  Micro Data:  BCx 6/13>NTD Tracheal aspirate 6/13>NTD  Antimicrobials:  Ceftriaxone 6/12> Doxy 6/12>  Interim history/subjective:  Weaning well this AM Negative fluid balance overnight  Objective   Blood pressure (!) 89/53, pulse 70, temperature (!) 97.3 F (36.3 C), temperature source Oral, resp. rate 17, height 5' 5"  (1.651 m), weight 91.6 kg, SpO2 100 %. CVP:  [7 mmHg-12 mmHg] 7 mmHg  Vent Mode: PSV;CPAP FiO2 (%):  [40 %] 40 % Set Rate:  [18 bmp] 18 bmp Vt Set:  [490 mL] 490 mL PEEP:  [5 cmH20] 5 cmH20 Pressure Support:  [5 cmH20] 5 cmH20 Plateau Pressure:  [15 cmH20-23 cmH20] 15 cmH20   Intake/Output Summary (Last 24 hours) at 02/12/2019 0909 Last data filed at 02/12/2019 0800 Gross per 24 hour  Intake 2268.19 ml  Output 4725 ml  Net -2456.81 ml   Filed Weights   02/10/19 0349 02/11/19 0500 02/12/19 0500  Weight: 95.5 kg 92.9 kg 91.6 kg   Examination: General: Well appearing, NAD  HENT: [place]/AT, PERRL, EOM-I and MMM, ETT in place Lungs: Coarse BS diffusely Cardiovascular: RRR, Nl S1/S2 and -M/R/G Abdomen: Soft, NT, ND and +BS Extremities: Diffuse erythema but no edema or tenderness  Neuro: Off sedation, moving all ext to command GU: Foley in place  Assessment & Plan:  68 year old man 70+ packyear active smoker with history of COPD, hypertension,  hyperlipidemia, peripheral vascular disease, diverticular disease, sleep apnea (not on CPAP), anxiety presented with dyspnea found to have acute hypoxic and hypercarbic respiratory failure, 4 min cardiac arrest, STEMI s/p TPA on 02/08/2019 at 2030 and transferred here from [place].  Acute hypoxic and hypercarbic respiratory failure:  - Extubation today - Continue diureses  - Titrate O2 for sat of 88-92% - BD as ordered - IS - Flutter valve - OOB - Ambulate - SLP  CAP, COPD exacerbation: - Brovana, Pulmicort neb BID - Duonebs scheduled and albprn wheezing/dyspnea - Continue ceftriaxone for 5 days and azithromycin 547m for 3 days - Prednisone 479mx 5 days  STEMI/CAD, cardiac arrest: - Cardiology consulted, cath complete, needs CABG but CVTS would like to him to be extubated and ambulating prior to procedure - Heparin gtt, asa, plavix - Hold off on statin for now as he has some transaminitis - TTE with very low EF - Active diureses - Maintain as dry as able  Shock, septic versus cardiogenic, transaminitis, hx HTN:  - D/C levophed - Lasix 40 mg IV x1 - Replace K and Phos - Hold home antihypertensives  Anxiety: - Continue home Wellbutrin, citalopram.  - Continue home Klonopin at half dose to prevent withdrawal  - D/C fentanyl, PRN versed  Best practice:  Diet: NPO Pain/Anxiety/Delirium protocol (if indicated): Fent gtt and prn, discontinued versed gtt and can start propofol if needed for goal RASS 0 VAP protocol (if indicated): PPI daily DVT prophylaxis: hep gtt GI prophylaxis: PPI daily Glucose control: Monitor Mobility: OOB when able Code Status: FULL Family Communication: Updated wife and son via phone Disposition: ICU  Labs   CBC: Recent Labs  Lab 01/28/2019 0434  02/10/19 0354  02/10/19 1827 02/11/19 0256 02/11/19 0305 02/12/19 0350 02/12/19 0425  WBC 18.0*  --  16.1*  --   --   --  15.3* 18.6*  --   NEUTROABS 16.1*  --   --   --   --   --   --   --   --    HGB 14.0   < > 11.4*   < > 11.9* 11.6* 11.6* 11.9* 11.6*  HCT 43.0   < > 33.8*   < > 35.0* 34.0* 34.1* 35.7* 34.0*  MCV 93.5  --  89.9  --   --   --  90.9 91.8  --   PLT 270  --  239  --   --   --  256 282  --    < > = values in this interval not displayed.   Basic Metabolic Panel: Recent Labs  Lab 02/17/2019 0434  02/10/19 0354  02/10/19 2352 02/11/19 0256 02/11/19 0305 02/11/19 2000 02/12/19 0350 02/12/19 0425  NA 134*   < > 135   < > 137 137 137  --  137 135  K 4.3   < > 2.8*   < > 3.6 3.6 3.6  --  3.5 3.3*  CL 100  --  101  --  101  --  100  --  95*  --   CO2 22  --  24  --  25  --  26  --  31  --   GLUCOSE 143*  --  139*  --  143*  --  136*  --  122*  --   BUN 21  --  21  --  25*  --  26*  --  31*  --   CREATININE 1.51*  --  1.07  --  1.16  --  1.13  --  1.04  --   CALCIUM 8.4*  --  8.0*  --  8.1*  --  8.1*  --  8.9  --   MG 2.0  --  1.9  --  2.0  --  1.9  --  2.3  --   PHOS 4.5  --  1.4*  --   --   --  1.6* 2.8 2.7  --    < > = values in this interval not displayed.   Recent Labs  Lab 02/07/2019 0434 02/10/19 0354 02/11/19 0305 02/12/19 0350  WBC 18.0* 16.1* 15.3* 18.6*  LATICACIDVEN 1.2  --   --   --    ABG    Component Value Date/Time   PHART 7.534 (H) 02/12/2019 0425   PCO2ART 41.0 02/12/2019 0425   PO2ART 95.0 02/12/2019 0425   HCO3 34.6 (H) 02/12/2019 0425   TCO2 36 (H) 02/12/2019 0425   ACIDBASEDEF 1.0 02/10/2019 1827   O2SAT 61.2 02/12/2019 0605   The patient is critically ill with multiple organ systems failure and requires high complexity decision making for assessment and support, frequent evaluation and titration of therapies, application of advanced monitoring technologies and extensive interpretation of multiple databases.   Critical Care Time devoted to patient care services described in this note is  32  Minutes. This time reflects time of care of this signee Dr Jennet Maduro. This critical care time does not reflect procedure time, or teaching  time or supervisory time of PA/NP/Med student/Med Resident etc but could involve care discussion time.  Rush Farmer, M.D. [facility] Pulmonary/Critical Care Medicine. Pager: [phone]. After hours pager: [phone].

## 2019-02-12 NOTE — Plan of Care (Signed)
  Problem: Safety: Goal: Ability to remain free from injury will improve Outcome: Adequate for Discharge   

## 2019-02-12 NOTE — Progress Notes (Signed)
ANTICOAGULATION CONSULT NOTE   Pharmacy Consult for heparin Indication: chest pain/ACS  No Known Allergies  Patient Measurements: Height: 5' 5" (165.1 cm) Weight: 204 lb 12.9 oz (92.9 kg) IBW/kg (Calculated) : 61.5 Heparin Dosing Weight: 83.1  Vital Signs: Temp: 97.8 F (36.6 C) (06/16 0400) Temp Source: Oral (06/16 0400) BP: 87/68 (06/16 0400) Pulse Rate: 59 (06/16 0400)  Labs: Recent Labs    02/17/2019 1639 02/08/2019 2258  02/10/19 0354  02/10/19 1557  02/10/19 2352  02/11/19 0305 02/12/19 0350 02/12/19 0425  HGB  --   --   --  11.4*   < >  --    < >  --    < > 11.6* 11.9* 11.6*  HCT  --   --   --  33.8*   < >  --    < >  --    < > 34.1* 35.7* 34.0*  PLT  --   --   --  239  --   --   --   --   --  256 282  --   HEPARINUNFRC  --  <0.10*  --   --    < > 0.49  --   --   --  0.23* <0.10*  --   CREATININE  --   --    < > 1.07  --   --   --  1.16  --  1.13 1.04  --   TROPONINI 54.52* 29.52*  --   --   --   --   --   --   --   --   --   --    < > = values in this interval not displayed.    Estimated Creatinine Clearance: 70.3 mL/min (by C-G formula based on SCr of 1.04 mg/dL).   Medical History: Past Medical History:  Diagnosis Date  . Anxiety   . COPD (chronic obstructive pulmonary disease) (HCC)   . Hypertension   . OSA (obstructive sleep apnea)      Assessment: 68 yoM transferred from [place] with STEMI s/p tPA. Pt started on heparin and then taken to the cath lab today revealing 3-vessel CAD. Heparin restarted 6hr after sheath pull.   Heparin level this am <0.1 units/ml.  No issues noted with infusion  Goal of Therapy:  Heparin level 0.3-0.7 units/ml Monitor platelets by anticoagulation protocol: Yes   Plan:  Increase heparin to 1600 units/hr Check heparin level 6 hours after rate increase Daily heparin level and CBC   Thanks for allowing pharmacy to be a part of this patient's care.  Brent Carter, PharmD Clinical Pharmacist   

## 2019-02-12 NOTE — Procedures (Signed)
Extubation Procedure Note  Patient Details:   Name: Brent Carter DOB: 05/21/1951 MRN: [id number]   Airway Documentation:    Vent end date: 02/12/19 Vent end time: 0943   Evaluation  O2 sats: stable throughout Complications: No apparent complications Patient did tolerate procedure well. Bilateral Breath Sounds: Diminished     Positive cuff leak. Patient extubated to 6L [place] with humidity. No stridor noted. Patient has a good cough and is able to speak.   [place] 02/12/2019, 9:47 AM

## 2019-02-12 NOTE — Progress Notes (Signed)
Fentanyl drip d/c'd by CCM today, 200 mL fentanyl wasted in med room stericycle bin with [place] RN.  Nelva Bush RN

## 2019-02-12 NOTE — Progress Notes (Signed)
Patient ID: Brent Carter, male   DOB: [birth year], 68 y.o.   MRN: [id number] TCTS DAILY ICU PROGRESS NOTE                   [address]            [place] [address]          [phone]   3 Days Post-Op Procedure(s) (LRB): LEFT HEART CATH AND CORONARY ANGIOGRAPHY (N/A)  Total Length of Stay:  LOS: 3 days   Subjective: Patient awake neurologically intact following commands, remains on ventilator.  The patient's pulmonary status prior to his admission is somewhat unclear but appears to have severe COPD not fully evaluated, patient was using home oxygen prior to admission, the extent of this use is unclear.  Objective: Vital signs in last 24 hours: Temp:  [97.8 F (36.6 C)-98.6 F (37 C)] 97.8 F (36.6 C) (06/16 0400) Pulse Rate:  [59-83] 62 (06/16 0600) Cardiac Rhythm: Normal sinus rhythm (06/16 0400) Resp:  [15-19] 19 (06/16 0600) BP: (74-156)/(43-120) 134/65 (06/16 0600) SpO2:  [96 %-100 %] 100 % (06/16 0600) FiO2 (%):  [40 %] 40 % (06/16 0400) Weight:  [91.6 kg] 91.6 kg (06/16 0500)  Filed Weights   02/10/19 0349 02/11/19 0500 02/12/19 0500  Weight: 95.5 kg 92.9 kg 91.6 kg    Weight change: -1.3 kg   Hemodynamic parameters for last 24 hours: CVP:  [7 mmHg-12 mmHg] 7 mmHg  Intake/Output from previous day: 06/15 0701 - 06/16 0700 In: 2661.6 [I.V.:770.1; NG/GT:540; IV Piggyback:1111.5] Out: 2536 [Urine:4725]  Intake/Output this shift: No intake/output data recorded.  Current Meds: Scheduled Meds:  arformoterol  15 mcg Nebulization BID   aspirin  81 mg Per Tube Daily   atorvastatin  80 mg Per Tube q1800   budesonide (PULMICORT) nebulizer solution  0.25 mg Nebulization BID   buPROPion  50 mg Per Tube TID   chlorhexidine gluconate (MEDLINE KIT)  15 mL Mouth Rinse BID   Chlorhexidine Gluconate Cloth  6 each Topical Daily   citalopram  20 mg Per Tube Daily   clonazePAM  0.25 mg Per Tube Daily   digoxin  0.125 mg Per Tube Daily    fentaNYL (SUBLIMAZE) injection  25 mcg Intravenous Once   ipratropium-albuterol  3 mL Nebulization TID   mouth rinse  15 mL Mouth Rinse 10 times per day   pantoprazole sodium  40 mg Per Tube Q1200   predniSONE  40 mg Per Tube Daily   rocuronium  50 mg Intravenous Once   sodium chloride flush  3 mL Intravenous Q12H   spironolactone  12.5 mg Per Tube Daily   Continuous Infusions:  sodium chloride Stopped (02/10/19 0520)   cefTRIAXone (ROCEPHIN)  IV Stopped (02/11/19 2100)   dexmedetomidine (PRECEDEX) IV infusion 0.2 mcg/kg/hr (02/12/19 0700)   doxycycline (VIBRAMYCIN) IV Stopped (02/11/19 2337)   fentaNYL infusion INTRAVENOUS 75 mcg/hr (02/12/19 0700)   heparin 1,600 Units/hr (02/12/19 0700)   norepinephrine (LEVOPHED) Adult infusion 2 mcg/min (02/12/19 0700)   potassium chloride 10 mEq (02/12/19 0638)   PRN Meds:.sodium chloride, acetaminophen, albuterol, bisacodyl, docusate, fentaNYL, midazolam, ondansetron (ZOFRAN) IV, sodium chloride flush  General appearance: alert, mild distress and On ventilator Neurologic: intact Heart: regular rate and rhythm, S1, S2 normal, no murmur, click, rub or gallop Lungs: rales bilaterally and rhonchi bilaterally Abdomen: soft, non-tender; bowel sounds normal; no masses,  no organomegaly Extremities: extremities normal, atraumatic, no cyanosis or edema  Lab Results: CBC: Recent  Labs    02/11/19 0305 02/12/19 0350 02/12/19 0425  WBC 15.3* 18.6*  --   HGB 11.6* 11.9* 11.6*  HCT 34.1* 35.7* 34.0*  PLT 256 282  --    BMET:  Recent Labs    02/11/19 0305 02/12/19 0350 02/12/19 0425  NA 137 137 135  K 3.6 3.5 3.3*  CL 100 95*  --   CO2 26 31  --   GLUCOSE 136* 122*  --   BUN 26* 31*  --   CREATININE 1.13 1.04  --   CALCIUM 8.1* 8.9  --     CMET: Lab Results  Component Value Date   WBC 18.6 (H) 02/12/2019   HGB 11.6 (L) 02/12/2019   HCT 34.0 (L) 02/12/2019   PLT 282 02/12/2019   GLUCOSE 122 (H) 02/12/2019   CHOL 115  02/19/2019   TRIG 106 02/12/2019   HDL 36 (L) 02/25/2019   LDLCALC 71 01/28/2019   ALT 92 (H) 02/12/2019   AST 73 (H) 02/12/2019   NA 135 02/12/2019   K 3.3 (L) 02/12/2019   CL 95 (L) 02/12/2019   CREATININE 1.04 02/12/2019   BUN 31 (H) 02/12/2019   CO2 31 02/12/2019   INR 1.3 (H) 02/03/2019   HGBA1C 5.6 02/26/2019  Dg Chest 1 View  Result Date: 02/10/2019 CLINICAL DATA:  Respiratory distress EXAM: CHEST  1 VIEW COMPARISON:  02/10/19 FINDINGS: Interval extubation. Central line remains. Stable cardiac silhouette. Diffuse bilateral airspace disease greater on the RIGHT is unchanged. No new atelectasis. IMPRESSION: 1. Extubation without increased atelectasis. 2. Diffuse bilateral airspace disease not improved. Worse on the RIGHT. Electronically Signed   By: Suzy Bouchard M.D.   On: 02/10/2019 18:05   Dg Chest Port 1 View  Result Date: 02/11/2019 CLINICAL DATA:  Respiratory failure. EXAM: PORTABLE CHEST 1 VIEW COMPARISON:  02/10/2019. FINDINGS: Endotracheal tube, NG tube, right IJ line in stable position. Stable cardiomegaly. Diffuse bilateral pulmonary infiltrates/edema again noted. Stable bilateral pleural effusions. No pneumothorax. Degenerative change thoracic spine. IMPRESSION: 1.  Lines and tubes stable position. 2. Stable cardiomegaly. Diffuse bilateral pulmonary infiltrates/edema again noted. No significant interim change. Stable bilateral pleural effusions. Electronically Signed   By: Marcello Moores  Register   On: 02/11/2019 07:45   Dg Chest Port 1 View  Result Date: 02/10/2019 CLINICAL DATA:  Intubations, respiratory failure EXAM: PORTABLE CHEST 1 VIEW COMPARISON:  02/10/2019 FINDINGS: Endotracheal tube tip 8.3 cm above the carina, just below the thoracic inlet. Right internal jugular central venous catheter tip: Just into the SVC. Cervical plate and screw fixator. Atherosclerotic calcification of the aortic arch. Bibasilar airspace opacities right greater than left, similar to prior.  Bilateral interstitial accentuation similar to prior. Mild left basilar airspace opacity, similar to prior. Mildly prominent main pulmonary artery. The costophrenic angles are excluded. Tapering of the peripheral pulmonary vasculature favors emphysema. IMPRESSION: 1. The endotracheal tube tip is just below the thoracic inlet, 8.3 cm above the carina. 2. Stable interstitial and airspace opacities potentially from bilateral pneumonia or pulmonary edema. 3. Aortic Atherosclerosis (ICD10-I70.0) and Emphysema (ICD10-J43.9). Electronically Signed   By: Van Clines M.D.   On: 02/10/2019 18:23   Dg Chest Port 1 View  Result Date: 02/10/2019 CLINICAL DATA:  Intubation EXAM: PORTABLE CHEST 1 VIEW COMPARISON:  02/06/2019 FINDINGS: Endotracheal tube 9.2 cm above the carina. Right IJ central line tip upper SVC level. NG tube enters the stomach. No enlarging effusion or pneumothorax. Improvement in the asymmetric predominately interstitial opacities throughout the mid and lower  lungs, worse on the right with peripheral Kerley B-lines. Suspect improvement in the pulmonary edema pattern. Difficult to exclude basilar pneumonia. Aorta atherosclerotic. IMPRESSION: Slight improvement in pulmonary edema pattern. Stable support apparatus Electronically Signed   By: Jerilynn Mages.  Shick M.D.   On: 02/10/2019 08:59    PT/INR: No results for input(s): LABPROT, INR in the last 72 hours. Radiology: No results found. Results for orders placed or performed during the hospital encounter of 02/24/2019  MRSA PCR Screening     Status: None   Collection Time: 02/07/2019  3:25 AM   Specimen: Nasal Mucosa; Nasopharyngeal  Result Value Ref Range Status   MRSA by PCR NEGATIVE NEGATIVE Final    Comment:        The GeneXpert MRSA Assay (FDA approved for NASAL specimens only), is one component of a comprehensive MRSA colonization surveillance program. It is not intended to diagnose MRSA infection nor to guide or monitor treatment for MRSA  infections. Performed at [facility], [place] [address] [place], [place] [address]   Culture, respiratory (tracheal aspirate)     Status: None   Collection Time: 01/31/2019  3:51 AM   Specimen: Tracheal Aspirate; Respiratory  Result Value Ref Range Status   Specimen Description TRACHEAL ASPIRATE  Final   Special Requests Normal  Final   Culture   Final    RARE Consistent with normal respiratory flora. Performed at [facility], [place] [address], [place], [place] [address]    Report Status 02/11/2019 FINAL  Final  Culture, blood (routine x 2)     Status: None (Preliminary result)   Collection Time: 01/28/2019  4:34 AM   Specimen: BLOOD  Result Value Ref Range Status   Specimen Description BLOOD CENTRAL LINE  Final   Special Requests   Final    BOTTLES DRAWN AEROBIC ONLY Blood Culture adequate volume   Culture   Final    NO GROWTH 1 DAY Performed at [facility], [place] [address] [place], [place] [address]    Report Status PENDING  Incomplete  Culture, blood (routine x 2)     Status: None (Preliminary result)   Collection Time: 02/08/2019  5:39 AM   Specimen: BLOOD  Result Value Ref Range Status   Specimen Description BLOOD LEFT FOOT  Final   Special Requests   Final    BOTTLES DRAWN AEROBIC ONLY Blood Culture results may not be optimal due to an inadequate volume of blood received in culture bottles   Culture   Final    NO GROWTH 1 DAY Performed at [facility], [place] [address], [place], [place] [address]    Report Status PENDING  Incomplete    Assessment/Plan: S/P Procedure(s) (LRB): LEFT HEART CATH AND CORONARY ANGIOGRAPHY (N/A) Bilateral diffuse pulmonary infiltrates White count remains elevated Before patient would be considered for coronary bypass his pulmonary status will need to be of markedly improved     Grace Isaac 02/12/2019 7:35 AM

## 2019-02-12 NOTE — Progress Notes (Signed)
Patient ID: Brent Carter, male   DOB: July 20, 1951, 68 y.o.   MRN: [id number]     Advanced Heart Failure Rounding Note  PCP-Cardiologist: No primary care provider on file.   Subjective:    Patient received tPA at [facility] on 6/1. Cath on 6/13 with severe 3v CAD including 90% ostial LAD. Echo EF 30-35% LVEDP 15  Has been seen by TCTS. Pending CABG if Pulmonary status permits.    Extubated yesterday 6/14 and had to be re-intubated. Now awake on vent FiO2 40%. Eager to get tube out.   Has diuresed well. Weight down 3 pounds. Diuresing well with IV lasix. CVP 8-9 On NE @ 2. Co-ox 61%.   CXR with multifocal infiltrates Personally reviewed  Objective:   Weight Range: 91.6 kg Body mass index is 33.6 kg/m.   Vital Signs:   Temp:  [97.3 F (36.3 C)-98.6 F (37 C)] 97.3 F (36.3 C) (06/16 0700) Pulse Rate:  [58-83] 70 (06/16 0806) Resp:  [15-22] 17 (06/16 0806) BP: (74-134)/(43-108) 89/53 (06/16 0806) SpO2:  [96 %-100 %] 100 % (06/16 0810) FiO2 (%):  [40 %] 40 % (06/16 0810) Weight:  [91.6 kg] 91.6 kg (06/16 0500) Last BM Date: (PTA)  Weight change: Filed Weights   02/10/19 0349 02/11/19 0500 02/12/19 0500  Weight: 95.5 kg 92.9 kg 91.6 kg    Intake/Output:   Intake/Output Summary (Last 24 hours) at 02/12/2019 0823 Last data filed at 02/12/2019 0800 Gross per 24 hour  Intake 2612.45 ml  Output 4725 ml  Net -2112.55 ml      Physical Exam    General:  Awake on vent. No resp difficulty HEENT: normal Neck: supple. JVp 8-9. Carotids 2+ bilat; no bruits. No lymphadenopathy or thryomegaly appreciated. Cor: PMI nondisplaced. Regular rate & rhythm. No rubs, gallops or murmurs. Lungs: coarse Abdomen: soft, nontender, nondistended. No hepatosplenomegaly. No bruits or masses. Good bowel sounds. Extremities: no cyanosis, clubbing, rash, edema  Diffuse ecchymosis Neuro: alert & orientedx3, cranial nerves grossly intact. moves all 4 extremities w/o difficulty. Affect pleasant    Telemetry   Sinus 70s Personally reviewed   Labs    CBC Recent Labs    02/11/19 0305 02/12/19 0350 02/12/19 0425  WBC 15.3* 18.6*  --   HGB 11.6* 11.9* 11.6*  HCT 34.1* 35.7* 34.0*  MCV 90.9 91.8  --   PLT 256 282  --    Basic Metabolic Panel Recent Labs    02/11/19 0305 02/11/19 2000 02/12/19 0350 02/12/19 0425  NA 137  --  137 135  K 3.6  --  3.5 3.3*  CL 100  --  95*  --   CO2 26  --  31  --   GLUCOSE 136*  --  122*  --   BUN 26*  --  31*  --   CREATININE 1.13  --  1.04  --   CALCIUM 8.1*  --  8.9  --   MG 1.9  --  2.3  --   PHOS 1.6* 2.8 2.7  --    Liver Function Tests Recent Labs    02/11/19 0305 02/12/19 0350  AST 134* 73*  ALT 102* 92*  ALKPHOS 56 58  BILITOT 0.8 0.8  PROT 5.1* 5.5*  ALBUMIN 2.6* 3.0*   No results for input(s): LIPASE, AMYLASE in the last 72 hours. Cardiac Enzymes Recent Labs    02/11/2019 1639 02/18/2019 2258  TROPONINI 54.52* 29.52*    BNP: BNP (last 3 results) Recent Labs  02/07/2019 0434  BNP 1,279.0*    ProBNP (last 3 results) No results for input(s): PROBNP in the last 8760 hours.   D-Dimer No results for input(s): DDIMER in the last 72 hours. Hemoglobin A1C No results for input(s): HGBA1C in the last 72 hours. Fasting Lipid Panel Recent Labs    02/12/19 0350  TRIG 106   Thyroid Function Tests No results for input(s): TSH, T4TOTAL, T3FREE, THYROIDAB in the last 72 hours.  Invalid input(s): FREET3  Other results:   Imaging    Dg Chest Port 1 View  Result Date: 02/12/2019 CLINICAL DATA:  ETT placement EXAM: PORTABLE CHEST 1 VIEW COMPARISON:  02/11/2019 FINDINGS: Endotracheal tube with the tip 5.1 cm above the carina. Nasogastric tube coursing below the diaphragm with the proximal port above the esophagogastric junction. Recommend advancing the nasogastric tube 10 cm. Right jugular central venous catheter with the tip projecting over the SVC. Bilateral lower lobe airspace disease, right greater than  left. Bilateral interstitial and patchy alveolar airspace opacities. No pneumothorax. Trace left pleural effusion. Stable cardiomediastinal silhouette. No acute osseous abnormality. IMPRESSION: 1. Endotracheal tube with the tip 5.1 cm above the carina. 2. Nasogastric tube coursing below the diaphragm with the proximal port above the esophagogastric junction. Recommend advancing the nasogastric tube 10 cm. 3. Right jugular central venous catheter with the tip projecting over the SVC. 4. Multilobar pneumonia. Electronically Signed   By: Kathreen Devoid   On: 02/12/2019 08:20     Medications:     Scheduled Medications: . arformoterol  15 mcg Nebulization BID  . aspirin  81 mg Per Tube Daily  . atorvastatin  80 mg Per Tube q1800  . budesonide (PULMICORT) nebulizer solution  0.25 mg Nebulization BID  . buPROPion  50 mg Per Tube TID  . chlorhexidine gluconate (MEDLINE KIT)  15 mL Mouth Rinse BID  . Chlorhexidine Gluconate Cloth  6 each Topical Daily  . citalopram  20 mg Per Tube Daily  . clonazePAM  0.25 mg Per Tube Daily  . digoxin  0.125 mg Per Tube Daily  . fentaNYL (SUBLIMAZE) injection  25 mcg Intravenous Once  . ipratropium-albuterol  3 mL Nebulization TID  . mouth rinse  15 mL Mouth Rinse 10 times per day  . pantoprazole sodium  40 mg Per Tube Q1200  . potassium chloride  40 mEq Per Tube Once  . predniSONE  40 mg Per Tube Daily  . rocuronium  50 mg Intravenous Once  . sodium chloride flush  3 mL Intravenous Q12H  . spironolactone  12.5 mg Per Tube Daily    Infusions: . sodium chloride Stopped (02/10/19 0520)  . cefTRIAXone (ROCEPHIN)  IV Stopped (02/11/19 2100)  . dexmedetomidine (PRECEDEX) IV infusion Stopped (02/12/19 0722)  . doxycycline (VIBRAMYCIN) IV Stopped (02/11/19 2337)  . fentaNYL infusion INTRAVENOUS Stopped (02/12/19 0728)  . heparin 1,600 Units/hr (02/12/19 0800)  . norepinephrine (LEVOPHED) Adult infusion 2 mcg/min (02/12/19 0800)    PRN Medications: sodium  chloride, acetaminophen, albuterol, bisacodyl, docusate, fentaNYL, midazolam, ondansetron (ZOFRAN) IV, sodium chloride flush        Assessment/Plan   1. CAD: Anterior STEMI.  Patient [facility] with [facility] on [facility]/12.  There was concern for coronavirus and tPA was given while waiting for COVID-19 testing.  This was negative.  - Peak trop 54.5 -> 29.5 - Cath with 6/13 with 3v CAD. EF 30-35% - Continue heparin gtt, statin, ASA 81. Got 1 dose Plavix 75 on 6/13 - TCTS has seen for  possible CABG. Will need to optimize hemodynamics and respiratory status markedly first. Continue diuresis and treatment of PNA  2. Acute systolic CHF: Ischemic cardiomyopathy.  EF 30% - CVP 15 -> 8-9. Continue diuresis this am - Remains on NE 2. Co-ox 61%. Follow closely. Wean NE as tolerated - Continue dig and spiro.  - No b-blocker or ARNI yet while on NE  3. Acute hypoxemic respiratory failure:  PNA/COPD exacerbation. Suspect there is a component of pulmonary edema as well.  CXR with infiltrates on CXR (R>L base).   - On ceftriaxone/doxycycline - He is on prednisone per CCM.  - Extubated 6/14 but reintubated same day  - hopefully can try to wean again today with lower CVP however CXR still with diffuse pulmonary infiltrates  4. COPD:  - Treating for AECOPD/PNA per CCM with abx, prednisone, nebs.   6. ?AKI: Creatinine 1.5, unsure of baseline. May have in setting of hypotension/cardiac arrest.  - Resolved. Creatinine 1.0 today  7. Cardiac arrest:  - Prior to tPA, uncertain rhythm, had 4 minutes CPR + epinephrine, no shock. No apparent neurologic damage (alert, follows commands on vent).   8. Elevated LFTs:  - Likely related to hypoperfusion with arrest.   9. ABL anemia - hgb 14 -> 12 in setting of t-PA and bleeding from central line. Now improved. Will follow.   10. Hypokalemia/hypomagnesemia - supp  CRITICAL CARE Performed by: Glori Bickers  Total critical care time: 40 minutes   Critical care time was exclusive of separately billable procedures and treating other patients.  Critical care was necessary to treat or prevent imminent or life-threatening deterioration.  Critical care was time spent personally by me on the following activities: development of treatment plan with patient and/or surrogate as well as nursing, discussions with consultants, evaluation of patient's response to treatment, examination of patient, obtaining history from patient or surrogate, ordering and performing treatments and interventions, ordering and review of laboratory studies, ordering and review of radiographic studies, pulse oximetry and re-evaluation of patient's condition.   Length of Stay: [place], MD  02/12/2019, 8:23 AM  Advanced Heart Failure Team Pager [phone] (M-F; 7a - 4p)  Please contact [facility] for night-coverage after hours (4p -7a ) and weekends on [email or web address]

## 2019-02-12 NOTE — Progress Notes (Signed)
[place] for heparin Indication: chest pain/ACS  No Known Allergies  Patient Measurements: Height: 5\' 5"  (165.1 cm) Weight: 204 lb 12.9 oz (92.9 kg) IBW/kg (Calculated) : 61.5 Heparin Dosing Weight: 83.1  Vital Signs: Temp: 97.8 F (36.6 C) (06/16 0400) Temp Source: Oral (06/16 0400) BP: 87/68 (06/16 0400) Pulse Rate: 59 (06/16 0400)  Labs: Recent Labs    Mar 01, 2019 1639 March 01, 2019 2258  02/10/19 0354  02/10/19 1557  02/10/19 2352  02/11/19 0305 02/12/19 0350 02/12/19 0425  HGB  --   --   --  11.4*   < >  --    < >  --    < > 11.6* 11.9* 11.6*  HCT  --   --   --  33.8*   < >  --    < >  --    < > 34.1* 35.7* 34.0*  PLT  --   --   --  239  --   --   --   --   --  256 282  --   HEPARINUNFRC  --  <0.10*  --   --    < > 0.49  --   --   --  0.23* <0.10*  --   CREATININE  --   --    < > 1.07  --   --   --  1.16  --  1.13 1.04  --   TROPONINI 54.52* 29.52*  --   --   --   --   --   --   --   --   --   --    < > = values in this interval not displayed.    Estimated Creatinine Clearance: 70.3 mL/min (by C-G formula based on SCr of 1.04 mg/dL).   Medical History: Past Medical History:  Diagnosis Date  . Anxiety   . COPD (chronic obstructive pulmonary disease) ([place])   . Hypertension   . OSA (obstructive sleep apnea)      Assessment: 66 yoM transferred from [place] with STEMI s/p tPA. Pt started on heparin and then taken to the cath lab today revealing 3-vessel CAD. Heparin restarted 6hr after sheath pull.   Heparin level this am <0.1 units/ml.  No issues noted with infusion  Goal of Therapy:  Heparin level 0.3-0.7 units/ml Monitor platelets by anticoagulation protocol: Yes   Plan:  Increase heparin to 1600 units/hr Check heparin level 6 hours after rate increase Daily heparin level and CBC   Thanks for allowing pharmacy to be a part of this patient's care.  Excell Seltzer, PharmD Clinical Pharmacist

## 2019-02-12 NOTE — Progress Notes (Signed)
CRITICAL VALUE ALERT  Critical Value: Potassium 3.3  Date & Time Notied:  02/12/19 @ 05:04am  Provider Notified: Warren Lacy CCM   Orders Received/Actions taken: See [facility]

## 2019-02-12 NOTE — Progress Notes (Signed)
[place] for heparin Indication: chest pain/ACS  No Known Allergies  Patient Measurements: Height: 5\' 5"  (165.1 cm) Weight: 201 lb 15.1 oz (91.6 kg) IBW/kg (Calculated) : 61.5 Heparin Dosing Weight: 83.1  Vital Signs: Temp: 97.9 F (36.6 C) (06/16 1100) Temp Source: Oral (06/16 1100) BP: 105/49 (06/16 1245) Pulse Rate: 76 (06/16 1245)  Labs: Recent Labs    02/24/2019 1639 2019/02/24 2258  02/10/19 0354  02/10/19 2352  02/11/19 0305 02/12/19 0350 02/12/19 0425 02/12/19 1311  HGB  --   --   --  11.4*   < >  --    < > 11.6* 11.9* 11.6*  --   HCT  --   --   --  33.8*   < >  --    < > 34.1* 35.7* 34.0*  --   PLT  --   --   --  239  --   --   --  256 282  --   --   HEPARINUNFRC  --  <0.10*  --   --    < >  --   --  0.23* <0.10*  --  0.19*  CREATININE  --   --    < > 1.07  --  1.16  --  1.13 1.04  --   --   TROPONINI 54.52* 29.52*  --   --   --   --   --   --   --   --   --    < > = values in this interval not displayed.    Estimated Creatinine Clearance: 69.7 mL/min (by C-G formula based on SCr of 1.04 mg/dL).   Medical History: Past Medical History:  Diagnosis Date  . Anxiety   . COPD (chronic obstructive pulmonary disease) ([place])   . Hypertension   . OSA (obstructive sleep apnea)      Assessment: 47 yoM transferred from [place] with STEMI s/p tPA. Pt started on heparin and then taken to the cath lab today revealing 3-vessel CAD. Heparin restarted 6hr after sheath pull.   Heparin level subtherapeutic at 0.19 on heparin 1600 units/ml.  No issues noted with infusion or signs/symptoms of bleeding.CBC stable.  Goal of Therapy:  Heparin level 0.3-0.7 units/ml Monitor platelets by anticoagulation protocol: Yes   Plan:  Increase heparin to 1800 units/hr Check heparin level 6 hours after rate increase Daily heparin level and CBC   Thanks for allowing pharmacy to be a part of this patient's care.   Claiborne Billings,  PharmD PGY2 Cardiology Pharmacy Resident Please check AMION for all Pharmacist numbers by unit 02/12/2019 1:50 PM

## 2019-02-12 NOTE — Progress Notes (Signed)
Patient extubated to 6L [place]. Lasix given prior to extubation. He pulls 1000 mL on incentive spirometer.   RN will continue to monitor closely.

## 2019-02-12 NOTE — Progress Notes (Signed)
[facility] ADULT ICU REPLACEMENT PROTOCOL FOR AM LAB REPLACEMENT ONLY  The patient does apply for the [facility] Adult ICU Electrolyte Replacment Protocol based on the criteria listed below:   1. Is GFR >/= 40 ml/min? Yes.    Patient's GFR today is >60 2. Is urine output >/= 0.5 ml/kg/hr for the last 6 hours? Yes.   Patient's UOP is 1.2 ml/kg/hr 3. Is BUN < 60 mg/dL? Yes.    Patient's BUN today is 31 4. Abnormal electrolyte(s):Potassium 3.3 5. Ordered repletion with: Potassium per protocol 6. If a panic level lab has been reported, has the CCM MD in charge been notified? No..   Physician:    Conley Canal P 02/12/2019 5:10 AM

## 2019-02-12 NOTE — Progress Notes (Addendum)
ANTICOAGULATION CONSULT NOTE   Pharmacy Consult for heparin Indication: chest pain/ACS  No Known Allergies  Patient Measurements: Height: 5\' 5"  (165.1 cm) Weight: 201 lb 15.1 oz (91.6 kg) IBW/kg (Calculated) : 61.5 Heparin Dosing Weight: 83.1  Vital Signs: Temp: 98.2 F (36.8 C) (06/16 2000) Temp Source: Oral (06/16 2000) BP: 115/55 (06/16 2300) Pulse Rate: 71 (06/16 2300)  Labs: Recent Labs    02/10/19 0354  02/10/19 2352  02/11/19 0305 02/12/19 0350 02/12/19 0425 02/12/19 1311 02/12/19 1957 02/12/19 2159  HGB 11.4*   < >  --    < > 11.6* 11.9* 11.6*  --   --   --   HCT 33.8*   < >  --    < > 34.1* 35.7* 34.0*  --   --   --   PLT 239  --   --   --  256 282  --   --   --   --   HEPARINUNFRC  --    < >  --   --  0.23* <0.10*  --  0.19* 1.90* 1.22*  CREATININE 1.07  --  1.16  --  1.13 1.04  --   --   --   --    < > = values in this interval not displayed.    Estimated Creatinine Clearance: 69.7 mL/min (by C-G formula based on SCr of 1.04 mg/dL).   Medical History: Past Medical History:  Diagnosis Date  . Anxiety   . COPD (chronic obstructive pulmonary disease) ([place])   . Hypertension   . OSA (obstructive sleep apnea)      Assessment: 48 yoM transferred from [place] with STEMI s/p tPA. Pt started on heparin and then taken to the cath lab today revealing 3-vessel CAD. Awaiting CABG when ambulating and extubated  Heparin level 1.90, redraw 1.22 units/ml  Goal of Therapy:  Heparin level 0.3-0.7 units/ml Monitor platelets by anticoagulation protocol: Yes   Plan:  Decrease heparin to 1600 units/hr Daily heparin level and CBC  Thanks for allowing pharmacy to be a part of this patient's care.  Excell Seltzer, PharmD Clinical Pharmacist  Addum:  Heparin level 1.62 units/ml.  Feel level is error and will ask for redraw from peripheral line.

## 2019-02-12 NOTE — Evaluation (Signed)
Clinical/Bedside Swallow Evaluation Patient Details  Name: Brent Carter MRN: [id number] Date of Birth: 19-Dec-1950  Today's Date: 02/12/2019 Time: SLP Start Time (ACUTE ONLY): 1310 SLP Stop Time (ACUTE ONLY): 1335 SLP Time Calculation (min) (ACUTE ONLY): 25 min  Past Medical History:  Past Medical History:  Diagnosis Date  . Anxiety   . COPD (chronic obstructive pulmonary disease) ([facility])   . Hypertension   . OSA (obstructive sleep apnea)    Past Surgical History:  Past Surgical History:  Procedure Laterality Date  . LEFT HEART CATH AND CORONARY ANGIOGRAPHY N/A 09-13-2018   Procedure: LEFT HEART CATH AND CORONARY ANGIOGRAPHY;  Surgeon: Lennette BihariKelly, Thomas A, MD;  Location: [facility] INVASIVE CV LAB;  Service: Cardiovascular;  Laterality: N/A;   HPI:  Patient is a 70+ pack year active smoker with PMH: COPd, HTN, HLD, PVD, diverticular disease, sleep apnea (not on CPAP), anxiety, who [facility] with dyspnea and found to have acute hypoxic and hypercarbic respiratory failure, 4 minutes cardiac arrest, STEMI s/p TPA on 02/08/19 and was emergently intubated, [facility] Covid tested, negative. He was extubated on 6/14, but then had acute respiratory distress and was reintubated on 6/14, then extubated again on 6/16 in AM.   Assessment / Plan / Recommendation Clinical Impression  Patient presents with a mild pharyngeal dysphagia which appears to be primarily caused by prolonged intubation (5 days), with contributing factor being tobacco abuse. Patient strong, dry hoarse sounding voice which remained clear throughout evaluation (no wet or gurgled sounds). Patient did exhibit swallow initiation delays with thin liquids and regular solids, and he did endorse some difficulty with swallowing PO's secondary to having been intubated. Patient able to achieve full laryngeal elevation and good pharyngeal contraction per palpation, and delays in initiating swallow appeared to be secondary  to patient having to put forth more effort to do so. SLP Visit Diagnosis: Dysphagia, unspecified (R13.10)    Aspiration Risk  Mild aspiration risk    Diet Recommendation Dysphagia 3 (Mech soft);Thin liquid   Liquid Administration via: Straw;Cup Medication Administration: Whole meds with liquid Supervision: Patient able to self feed Compensations: Minimize environmental distractions;Slow rate;Small sips/bites Postural Changes: Seated upright at 90 degrees    Other  Recommendations Oral Care Recommendations: Oral care BID   Follow up Recommendations None      Frequency and Duration min 1 x/week  1 week       Prognosis        Swallow Study   General Date of Onset: 02/08/19 HPI: Patient is a 70+ pack year active smoker with PMH: COPd, HTN, HLD, PVD, diverticular disease, sleep apnea (not on CPAP), anxiety, who [facility] with dyspnea and found to have acute hypoxic and hypercarbic respiratory failure, 4 minutes cardiac arrest, STEMI s/p TPA on 02/08/19 and was emergently intubated, [facility]. Covid tested, negative. He was extubated on 6/14, but then had acute respiratory distress and was reintubated on 6/14, then extubated again on 6/16 in AM. Type of Study: Bedside Swallow Evaluation Previous Swallow Assessment: N/A Diet Prior to this Study: NPO Temperature Spikes Noted: No Respiratory Status: Nasal cannula History of Recent Intubation: Yes Length of Intubations (days): 5 days Date extubated: 02/12/19 Behavior/Cognition: Alert;Cooperative;Pleasant mood Oral Cavity Assessment: Within Functional Limits Oral Care Completed by SLP: Yes Oral Cavity - Dentition: Adequate natural dentition Vision: Functional for self-feeding Self-Feeding Abilities: Able to feed self Patient Positioning: Upright in chair Baseline Vocal Quality: Hoarse Volitional Cough: Strong Volitional Swallow:  Able to elicit    Oral/Motor/Sensory Function Overall Oral  Motor/Sensory Function: Within functional limits   Ice Chips     Thin Liquid Thin Liquid: Impaired Presentation: Straw;Self Fed Pharyngeal  Phase Impairments: Suspected delayed Swallow Other Comments: No overt s/s of aspiration but patient with suspected swallow initiation delay. He did stated that it felt slightly more difficult to swallow since extubation.    Nectar Thick     Honey Thick     Puree Puree: Impaired   Solid     Solid: Impaired Presentation: Self Fed Oral Phase Impairments: Impaired mastication Pharyngeal Phase Impairments: Suspected delayed Samella Parr 02/12/2019,3:46 PM    Sonia Baller, MA, CCC-SLP Speech Therapy [facility] Acute Rehab Pager: [phone]

## 2019-02-12 NOTE — Progress Notes (Signed)
.  Assisted tele visit to patient with son.  Jerrilyn Messinger Ferrer, RN  

## 2019-02-13 LAB — COMPREHENSIVE METABOLIC PANEL
ALT: 74 U/L — ABNORMAL HIGH (ref 0–44)
AST: 48 U/L — ABNORMAL HIGH (ref 15–41)
Albumin: 2.9 g/dL — ABNORMAL LOW (ref 3.5–5.0)
Alkaline Phosphatase: 52 U/L (ref 38–126)
Anion gap: 12 (ref 5–15)
BUN: 35 mg/dL — ABNORMAL HIGH (ref 8–23)
CO2: 29 mmol/L (ref 22–32)
Calcium: 9 mg/dL (ref 8.9–10.3)
Chloride: 90 mmol/L — ABNORMAL LOW (ref 98–111)
Creatinine, Ser: 1.2 mg/dL (ref 0.61–1.24)
GFR calc Af Amer: >60 mL/min (ref >60)
GFR calc non Af Amer: >60 mL/min (ref >60)
Glucose, Bld: 192 mg/dL — ABNORMAL HIGH (ref 70–99)
Potassium: 4.5 mmol/L (ref 3.5–5.1)
Sodium: 131 mmol/L — ABNORMAL LOW (ref 135–145)
Total Bilirubin: 0.8 mg/dL (ref 0.3–1.2)
Total Protein: 5.4 g/dL — ABNORMAL LOW (ref 6.5–8.1)

## 2019-02-13 LAB — COOXEMETRY PANEL
Carboxyhemoglobin: 1.2 % (ref 0.5–1.5)
Carboxyhemoglobin: 1.5 % (ref 0.5–1.5)
Carboxyhemoglobin: 2.1 % — ABNORMAL HIGH (ref 0.5–1.5)
Methemoglobin: 0.8 % (ref 0.0–1.5)
Methemoglobin: 0.9 % (ref 0.0–1.5)
Methemoglobin: 0.3 % (ref 0.0–1.5)
O2 Saturation: 41.3 %
O2 Saturation: 49.2 %
O2 Saturation: 57.1 %
Total hemoglobin: 11.5 g/dL — ABNORMAL LOW (ref 12.0–16.0)
Total hemoglobin: 11.6 g/dL — ABNORMAL LOW (ref 12.0–16.0)
Total hemoglobin: 11.3 g/dL — ABNORMAL LOW (ref 12.0–16.0)

## 2019-02-13 LAB — BASIC METABOLIC PANEL
Anion gap: 15 (ref 5–15)
BUN: 49 mg/dL — ABNORMAL HIGH (ref 8–23)
CO2: 30 mmol/L (ref 22–32)
Calcium: 9 mg/dL (ref 8.9–10.3)
Chloride: 88 mmol/L — ABNORMAL LOW (ref 98–111)
Creatinine, Ser: 1.62 mg/dL — ABNORMAL HIGH (ref 0.61–1.24)
GFR calc Af Amer: 49 mL/min — ABNORMAL LOW (ref >60)
GFR calc non Af Amer: 43 mL/min — ABNORMAL LOW (ref >60)
Glucose, Bld: 148 mg/dL — ABNORMAL HIGH (ref 70–99)
Potassium: 3.8 mmol/L (ref 3.5–5.1)
Sodium: 133 mmol/L — ABNORMAL LOW (ref 135–145)

## 2019-02-13 LAB — CBC
HCT: 35.2 % — ABNORMAL LOW (ref 39.0–52.0)
Hemoglobin: 11.6 g/dL — ABNORMAL LOW (ref 13.0–17.0)
MCH: 30.9 pg (ref 26.0–34.0)
MCHC: 33 g/dL (ref 30.0–36.0)
MCV: 93.9 fL (ref 80.0–100.0)
Platelets: 299 10*3/uL (ref 150–400)
RBC: 3.75 MIL/uL — ABNORMAL LOW (ref 4.22–5.81)
RDW: 13.9 % (ref 11.5–15.5)
WBC: 22.1 10*3/uL — ABNORMAL HIGH (ref 4.0–10.5)
nRBC: 0.2 % (ref 0.0–0.2)

## 2019-02-13 LAB — HEPARIN LEVEL (UNFRACTIONATED)
Heparin Unfractionated: 1.62 IU/mL — ABNORMAL HIGH (ref 0.30–0.70)
Heparin Unfractionated: 1.54 IU/mL — ABNORMAL HIGH (ref 0.30–0.70)
Heparin Unfractionated: 1.05 IU/mL — ABNORMAL HIGH (ref 0.30–0.70)

## 2019-02-13 LAB — TRIGLYCERIDES: Triglycerides: 68 mg/dL (ref <150)

## 2019-02-13 LAB — MAGNESIUM: Magnesium: 2.2 mg/dL (ref 1.7–2.4)

## 2019-02-13 LAB — PHOSPHORUS: Phosphorus: 5.9 mg/dL — ABNORMAL HIGH (ref 2.5–4.6)

## 2019-02-13 LAB — STREP PNEUMONIAE URINARY ANTIGEN: Strep Pneumo Urinary Antigen: NEGATIVE

## 2019-02-13 MED ORDER — SODIUM CHLORIDE 0.9% FLUSH
10.0000 mL | Freq: Two times a day (BID) | INTRAVENOUS | Status: DC
  Administered 2019-02-13 (×2): 10 mL
  Administered 2019-02-13: 30 mL

## 2019-02-13 MED ORDER — FUROSEMIDE 10 MG/ML IJ SOLN
80.0000 mg | Freq: Two times a day (BID) | INTRAMUSCULAR | Status: DC
  Administered 2019-02-13: 16:00:00 80 mg via INTRAVENOUS
  Filled 2019-02-13: qty 8

## 2019-02-13 MED ORDER — METOLAZONE 2.5 MG PO TABS
2.5000 mg | ORAL_TABLET | Freq: Once | ORAL | Status: AC
  Administered 2019-02-13: 16:00:00 2.5 mg via ORAL
  Filled 2019-02-13: qty 1

## 2019-02-13 MED ORDER — MILRINONE LACTATE IN DEXTROSE 20-5 MG/100ML-% IV SOLN
0.1250 ug/kg/min | INTRAVENOUS | Status: DC
  Administered 2019-02-13: 10:00:00 0.125 ug/kg/min via INTRAVENOUS
  Filled 2019-02-13 (×2): qty 100

## 2019-02-13 MED ORDER — IPRATROPIUM-ALBUTEROL 0.5-2.5 (3) MG/3ML IN SOLN
3.0000 mL | RESPIRATORY_TRACT | Status: DC
  Administered 2019-02-13 – 2019-02-14 (×5): 3 mL via RESPIRATORY_TRACT
  Filled 2019-02-13 (×5): qty 3

## 2019-02-13 MED ORDER — METHYLPREDNISOLONE SODIUM SUCC 125 MG IJ SOLR
60.0000 mg | Freq: Three times a day (TID) | INTRAMUSCULAR | Status: DC
  Administered 2019-02-13 – 2019-02-14 (×3): 60 mg via INTRAVENOUS
  Filled 2019-02-13 (×3): qty 2

## 2019-02-13 MED ORDER — SODIUM CHLORIDE 0.9% FLUSH: 10.0000 mL | INTRAVENOUS | Status: DC | PRN

## 2019-02-13 MED ORDER — FUROSEMIDE 10 MG/ML IJ SOLN
80.0000 mg | Freq: Once | INTRAMUSCULAR | Status: AC
  Administered 2019-02-13: 09:00:00 80 mg via INTRAVENOUS
  Filled 2019-02-13: qty 8

## 2019-02-13 MED ORDER — FUROSEMIDE 10 MG/ML IJ SOLN: 80.0000 mg | Freq: Two times a day (BID) | INTRAMUSCULAR | Status: DC

## 2019-02-13 MED ORDER — NOREPINEPHRINE 16 MG/250ML-% IV SOLN
0.0000 ug/min | INTRAVENOUS | Status: DC
  Filled 2019-02-13: qty 250

## 2019-02-13 MED ORDER — HEPARIN (PORCINE) 25000 UT/250ML-% IV SOLN
1200.0000 [IU]/h | INTRAVENOUS | Status: DC
  Administered 2019-02-13 – 2019-02-14 (×2): 1200 [IU]/h via INTRAVENOUS
  Filled 2019-02-13: qty 250

## 2019-02-13 MED ORDER — NOREPINEPHRINE 4 MG/250ML-% IV SOLN: 0.0000 ug/min | INTRAVENOUS | Status: DC

## 2019-02-13 MED ORDER — HEPARIN (PORCINE) 25000 UT/250ML-% IV SOLN: 1400.0000 [IU]/h | INTRAVENOUS | Status: DC

## 2019-02-13 NOTE — Progress Notes (Signed)
[place] for heparin Indication: chest pain/ACS  No Known Allergies  Patient Measurements: Height: 5\' 5"  (165.1 cm) Weight: 203 lb 7.8 oz (92.3 kg) IBW/kg (Calculated) : 61.5 Heparin Dosing Weight: 83.1  Vital Signs: Temp: 98.1 F (36.7 C) (06/17 1900) Temp Source: Axillary (06/17 1900) BP: 94/77 (06/17 1900) Pulse Rate: 94 (06/17 1900)  Labs: Recent Labs    02/11/19 0305 02/12/19 0350 02/12/19 0425  02/13/19 0409 02/13/19 0817 02/13/19 1847  HGB 11.6* 11.9* 11.6*  --  11.6*  --   --   HCT 34.1* 35.7* 34.0*  --  35.2*  --   --   PLT 256 282  --   --  299  --   --   HEPARINUNFRC 0.23* <0.10*  --    < > 1.62* 1.54* 1.05*  CREATININE 1.13 1.04  --   --  1.20  --  1.62*   < > = values in this interval not displayed.    Estimated Creatinine Clearance: 44.9 mL/min (A) (by C-G formula based on SCr of 1.62 mg/dL (H)).   Medical History: Past Medical History:  Diagnosis Date  . Anxiety   . COPD (chronic obstructive pulmonary disease) ([place])   . Hypertension   . OSA (obstructive sleep apnea)      Assessment: 62 yoM transferred from [place] with STEMI s/p tPA. Pt started on heparin and then taken to the cath lab today revealing 3-vessel CAD. Awaiting CABG when ambulating and extubated  Heparin level came back supratherapeutic at 1.05, on 1400 units/hr. Heparin infusion is running peripherally on R arm, level being drawn from central line. CBC stable, no s/sx of bleeding. No issues per nursing.   Goal of Therapy:  Heparin level 0.3-0.7 units/ml Monitor platelets by anticoagulation protocol: Yes   Plan:  Turn off heparin for one hour. Restart heparin at 1200 units/hr.  Heparin level in 6 hours Daily heparin level and CBC  Thanks for allowing pharmacy to be a part of this patient's care.  Antonietta Jewel, PharmD, BCCCP Clinical Pharmacist  Pager: [phone] Phone: [phone] Please check AMION for all Pharmacist numbers by  unit 02/13/2019 8:41 PM

## 2019-02-13 NOTE — Progress Notes (Signed)
ANTICOAGULATION CONSULT NOTE   Pharmacy Consult for heparin Indication: chest pain/ACS  No Known Allergies  Patient Measurements: Height: 5\' 5"  (165.1 cm) Weight: 203 lb 7.8 oz (92.3 kg) IBW/kg (Calculated) : 61.5 Heparin Dosing Weight: 83.1  Vital Signs: Temp: 97.9 F (36.6 C) (06/17 0817) Temp Source: Oral (06/17 0817) BP: 96/50 (06/17 0700) Pulse Rate: 98 (06/17 1006)  Labs: Recent Labs    02/11/19 0305 02/12/19 0350 02/12/19 0425  02/12/19 2159 02/13/19 0409 02/13/19 0817  HGB 11.6* 11.9* 11.6*  --   --  11.6*  --   HCT 34.1* 35.7* 34.0*  --   --  35.2*  --   PLT 256 282  --   --   --  299  --   HEPARINUNFRC 0.23* <0.10*  --    < > 1.22* 1.62* 1.54*  CREATININE 1.13 1.04  --   --   --  1.20  --    < > = values in this interval not displayed.    Estimated Creatinine Clearance: 60.6 mL/min (by C-G formula based on SCr of 1.2 mg/dL).   Medical History: Past Medical History:  Diagnosis Date  . Anxiety   . COPD (chronic obstructive pulmonary disease) ([place])   . Hypertension   . OSA (obstructive sleep apnea)      Assessment: 64 yoM transferred from [place] with STEMI s/p tPA. Pt started on heparin and then taken to the cath lab today revealing 3-vessel CAD. Awaiting CABG when ambulating and extubated  Heparin level this morning elevated at 1.62, Redrawn peripherally on right arm and remained elevated at 1.54 on heparin 1600 units/hr. CBC stable. Bruising noted on arms. No other signs/symptoms of bleeding or issues with infusion reported by nurse.   Goal of Therapy:  Heparin level 0.3-0.7 units/ml Monitor platelets by anticoagulation protocol: Yes   Plan:  Turn off heparin for one hour. Restart heparin at 1400 units/hr.  Heparin level in 6 hours Daily heparin level and CBC   Thanks for allowing pharmacy to be a part of this patient's care.   Claiborne Billings, PharmD PGY2 Cardiology Pharmacy Resident Phone [phone] Please check AMION for all  Pharmacist numbers by unit 02/13/2019 10:32 AM

## 2019-02-13 NOTE — Progress Notes (Signed)
SLP Cancellation Note  Patient Details Name: Brent Carter MRN: [id number] DOB: 11-20-1950   Cancelled treatment:       Reason Eval/Treat Not Completed: Medical issues which prohibited therapy. Pt has been requiring BiPAP this morning, although was temporarily off due to vomiting per RN. She reports concern for a more tenuous respiratory status. Will hold POs for now.   Venita Sheffield Mariachristina Holle 02/13/2019, 12:07 PM  Pollyann Glen, M.A. [facility] Environmental education officer [phone] Office [phone]

## 2019-02-13 NOTE — Progress Notes (Signed)
Patient ID: BERTHA EARWOOD, male   DOB: Sep 19, 1950, 68 y.o.   MRN: [id number]     Advanced Heart Failure Rounding Note  PCP-Cardiologist: No primary care provider on file.   Subjective:    Patient received tPA at [facility] on 6/1. Cath on 6/13 with severe 3v CAD including 90% ostial LAD. Echo EF 30-35% LVEDP 15  Has been seen by TCTS. Pending CABG if Pulmonary status permits.    Extubated 6/16. NE turned off this am. SBP dipped into 70s  Co-ox 41%. Renal function stable Weight up 2 pounds.   Feels more SOB. + wheeze. No CP. CVP 9-10  Objective:   Weight Range: 92.3 kg Body mass index is 33.86 kg/m.   Vital Signs:   Temp:  [97.3 F (36.3 C)-98.9 F (37.2 C)] 97.9 F (36.6 C) (06/17 0400) Pulse Rate:  [59-119] 91 (06/17 0700) Resp:  [9-31] 17 (06/17 0700) BP: (87-133)/(49-96) 96/50 (06/17 0700) SpO2:  [89 %-100 %] 95 % (06/17 0700) Weight:  [92.3 kg] 92.3 kg (06/17 0645) Last BM Date: (PTA)  Weight change: Filed Weights   02/11/19 0500 02/12/19 0500 02/13/19 0645  Weight: 92.9 kg 91.6 kg 92.3 kg    Intake/Output:   Intake/Output Summary (Last 24 hours) at 02/13/2019 0816 Last data filed at 02/13/2019 0700 Gross per 24 hour  Intake 2546.01 ml  Output 2595 ml  Net -48.99 ml      Physical Exam    General:  Sitting up in bed. SOB HEENT: normal Neck: supple. JVP to jaw . Carotids 2+ bilat; no bruits. No lymphadenopathy or thryomegaly appreciated. Cor: PMI nondisplaced. Regular rate & rhythm. No rubs, gallops or murmurs. Lungs: diffuse rhonchi and wheezing Abdomen: soft, nontender, nondistended. No hepatosplenomegaly. No bruits or masses. Good bowel sounds. Extremities: no cyanosis, clubbing, rash, 1+ edema. Diffuse ecchymosis  Neuro: alert & orientedx3, cranial nerves grossly intact. moves all 4 extremities w/o difficulty. Affect pleasan   Telemetry   Sinus 80-90s Personally reviewed   Labs    CBC Recent Labs    02/12/19 0350 02/12/19 0425 02/13/19  0409  WBC 18.6*  --  22.1*  HGB 11.9* 11.6* 11.6*  HCT 35.7* 34.0* 35.2*  MCV 91.8  --  93.9  PLT 282  --  332   Basic Metabolic Panel Recent Labs    02/12/19 0350 02/12/19 0425 02/13/19 0409  NA 137 135 131*  K 3.5 3.3* 4.5  CL 95*  --  90*  CO2 31  --  29  GLUCOSE 122*  --  192*  BUN 31*  --  35*  CREATININE 1.04  --  1.20  CALCIUM 8.9  --  9.0  MG 2.3  --  2.2  PHOS 2.7  --  5.9*   Liver Function Tests Recent Labs    02/12/19 0350 02/13/19 0409  AST 73* 48*  ALT 92* 74*  ALKPHOS 58 52  BILITOT 0.8 0.8  PROT 5.5* 5.4*  ALBUMIN 3.0* 2.9*   No results for input(s): LIPASE, AMYLASE in the last 72 hours. Cardiac Enzymes No results for input(s): CKTOTAL, CKMB, CKMBINDEX, TROPONINI in the last 72 hours.  BNP: BNP (last 3 results) Recent Labs    02/10/2019 0434  BNP 1,279.0*    ProBNP (last 3 results) No results for input(s): PROBNP in the last 8760 hours.   D-Dimer No results for input(s): DDIMER in the last 72 hours. Hemoglobin A1C No results for input(s): HGBA1C in the last 72 hours. Fasting Lipid Panel Recent  Labs    02/13/19 0409  TRIG 68   Thyroid Function Tests No results for input(s): TSH, T4TOTAL, T3FREE, THYROIDAB in the last 72 hours.  Invalid input(s): FREET3  Other results:   Imaging    No results found.   Medications:     Scheduled Medications: . arformoterol  15 mcg Nebulization BID  . aspirin  81 mg Per Tube Daily  . atorvastatin  80 mg Per Tube q1800  . budesonide (PULMICORT) nebulizer solution  0.25 mg Nebulization BID  . buPROPion  50 mg Per Tube TID  . chlorhexidine gluconate (MEDLINE KIT)  15 mL Mouth Rinse BID  . Chlorhexidine Gluconate Cloth  6 each Topical Daily  . citalopram  20 mg Per Tube Daily  . clonazePAM  0.25 mg Per Tube Daily  . digoxin  0.125 mg Per Tube Daily  . ipratropium-albuterol  3 mL Nebulization TID  . mouth rinse  15 mL Mouth Rinse BID  . pantoprazole (PROTONIX) IV  40 mg Intravenous Q24H   . predniSONE  40 mg Per Tube Daily  . sodium chloride flush  10-40 mL Intracatheter Q12H  . sodium chloride flush  3 mL Intravenous Q12H  . spironolactone  12.5 mg Per Tube Daily    Infusions: . sodium chloride Stopped (02/10/19 0520)  . dexmedetomidine (PRECEDEX) IV infusion Stopped (02/12/19 1838)  . doxycycline (VIBRAMYCIN) IV Stopped (02/12/19 2318)  . heparin 1,600 Units/hr (02/12/19 2324)  . norepinephrine (LEVOPHED) Adult infusion Stopped (02/13/19 0034)    PRN Medications: sodium chloride, acetaminophen, albuterol, bisacodyl, docusate, fentaNYL, midazolam, ondansetron (ZOFRAN) IV, sodium chloride flush, sodium chloride flush        Assessment/Plan   1. CAD: Anterior STEMI.  Patient presented to [facility] with [facility]I on 6/12.  There was concern for coronavirus and tPA was given while waiting for COVID-19 testing.  This was negative.  - Peak trop 54.5 -> 29.5 - Cath with 6/13 with 3v CAD. EF 30-35% - Continue heparin gtt, statin, ASA 81. Got 1 dose Plavix 75 on 6/13 - TCTS has seen for possible CABG. Will need to optimize hemodynamics and respiratory status markedly first. Continue diuresis and treatment of PNA - No CP currently  2. Acute systolic CHF: Ischemic cardiomyopathy.  EF 30% - Very tenuous. Off NE this am Co-ox 41% NE restarted at 3. Repeat co-ox. Will likely need milrinone  - CVP  10 - Lasix 80 IV bid today - Continue dig and spiro.  - No b-blocker or ARNI yet  3. Acute hypoxemic respiratory failure:  PNA/COPD exacerbation. Suspect there is a component of pulmonary edema as well.  CXR with infiltrates on CXR (R>L base).   - On ceftriaxone/doxycycline - He is on prednisone per CCM.  - Extubated 6/14 but reintubated same day. Extubated again 6/16 - Remains very tenuous with wheezing on exam  - Will restart IV lasix. Has severe underlying lung disease. CCM also managing.  - Needs continued pulmonary toilet and ambulation   4. COPD:  - Treating for  AECOPD/PNA per CCM with abx, prednisone, nebs.  - Aggressive pulmoanry toilet with IS and flutter valve  6. ?AKI: Creatinine 1.5, unsure of baseline. May have in setting of hypotension/cardiac arrest.  - Resolved. Creatinine 1.3 today  7. Cardiac arrest:  - Prior to tPA, uncertain rhythm, had 4 minutes CPR + epinephrine, no shock. No apparent neurologic damage (alert, follows commands on vent).   8. Elevated LFTs:  - Likely related to hypoperfusion with arrest.   9.  Hypokalemia/hypomagnesemia - supp   CRITICAL CARE Performed by: Glori Bickers  Total critical care time: 35 minutes  Critical care time was exclusive of separately billable procedures and treating other patients.  Critical care was necessary to treat or prevent imminent or life-threatening deterioration.  Critical care was time spent personally by me (independent of midlevel providers or residents) on the following activities: development of treatment plan with patient and/or surrogate as well as nursing, discussions with consultants, evaluation of patient's response to treatment, examination of patient, obtaining history from patient or surrogate, ordering and performing treatments and interventions, ordering and review of laboratory studies, ordering and review of radiographic studies, pulse oximetry and re-evaluation of patient's condition.     Length of Stay: [place], MD  02/13/2019, 8:16 AM  Advanced Heart Failure Team Pager [phone] (M-F; 7a - 4p)  Please contact [facility] for night-coverage after hours (4p -7a ) and weekends on [email or web address]

## 2019-02-13 NOTE — Progress Notes (Signed)
Nutrition Follow-up  DOCUMENTATION CODES:   Not applicable  INTERVENTION:   -If unable to advance diet- recommend Cortrak placement with initiation of enteral nutrition   -If breathing improves and diet is advanced recommend Ensure Enlive po BID, each supplement provides 350 kcal and 20 grams of protein once advanced   NUTRITION DIAGNOSIS:   Inadequate oral intake related to inability to eat as evidenced by NPO status.  Ongoing  GOAL:   Provide needs based on ASPEN/SCCM guidelines  Not met  MONITOR:   Diet advancement, Vent status, Skin, Weight trends, TF tolerance, Labs, I & O's  REASON FOR ASSESSMENT:   Ventilator    ASSESSMENT:   Patient with PMH significant for COPD, HTN, HLD, PVD, diverticular disease, and anxiety. Presents this admission from [place] with STEMI. Upon transfer had cardiac arrest.   6/13- s/p heart cath with severe 3v CAD 6/14- failed extubation  6/16- extubated, diet advanced to DYS 3/thin by SLP  RD working remotely.  Pt needs CABG. CVTS waiting until pt can ambulate.   Pt vomited this am and is now requiring BiPAP. Diet changed to NPO until breathing improves. Would consider Cortrak placement on Friday if diet is unable to advance. Pt is day 4 without adequate nutrition. No BM recorded in 4 days, PRN colace given this am. Will monitor for results.   Admission weight: 91.9 kg Current weight 92.3 kg   I/O: -1,102 ml since admit UOP: 2,595 ml x 24 hrs   Drips: milrinone, levophed Medications: 80 mg lasix BID, solumedrol, aldactone Labs: Na 131 (L) Phosphorus 5.9 (H) CBG 136-192  Diet Order:   Diet Order            Diet NPO time specified Except for: Sips with Meds  Diet effective now              EDUCATION NEEDS:   Not appropriate for education at this time  Skin:  Skin Assessment: Skin Integrity Issues: Skin Integrity Issues:: Other (Comment) Other: skin tear-left arm  Last BM:  PTA  Height:   Ht Readings from Last 1  Encounters:  02/10/19 5' 5"  (1.651 m)    Weight:   Wt Readings from Last 1 Encounters:  02/13/19 92.3 kg    Ideal Body Weight:  61.8 kg  BMI:  Body mass index is 33.86 kg/m.  Estimated Nutritional Needs:   Kcal:  2000-2200 kcal  Protein:  100-120 grams  Fluid:  >/= 2 L/day   Mariana Single RD, LDN Clinical Nutrition Pager # - [phone]

## 2019-02-13 NOTE — Progress Notes (Signed)
Patient ID: Brent Carter, male   DOB: 09/04/1950, 68 y.o.   MRN: [id number] TCTS DAILY ICU PROGRESS NOTE                   [address]            [place] [address]          [phone]   4 Days Post-Op Procedure(s) (LRB): LEFT HEART CATH AND CORONARY ANGIOGRAPHY (N/A)  Total Length of Stay:  LOS: 4 days   Subjective: Sob at rest but remains off vent. Notes has been on home o2 for al east month before admission   Objective: Vital signs in last 24 hours: Temp:  [97.3 F (36.3 C)-98.9 F (37.2 C)] 97.9 F (36.6 C) (06/17 0817) Pulse Rate:  [59-119] 91 (06/17 0700) Cardiac Rhythm: Normal sinus rhythm (06/17 0500) Resp:  [9-31] 17 (06/17 0700) BP: (87-133)/(49-96) 96/50 (06/17 0700) SpO2:  [89 %-100 %] 93 % (06/17 0827) Weight:  [92.3 kg] 92.3 kg (06/17 0645)  Filed Weights   02/11/19 0500 02/12/19 0500 02/13/19 0645  Weight: 92.9 kg 91.6 kg 92.3 kg    Weight change: 0.7 kg   Hemodynamic parameters for last 24 hours: CVP:  [5 mmHg-10 mmHg] 9 mmHg  Intake/Output from previous day: 06/16 0701 - 06/17 0700 In: 2567.7 [P.O.:720; I.V.:537.8; NG/GT:180; IV Piggyback:1129.9] Out: 2595 [Urine:2595]  Intake/Output this shift: No intake/output data recorded.  Current Meds: Scheduled Meds: . arformoterol  15 mcg Nebulization BID  . aspirin  81 mg Per Tube Daily  . atorvastatin  80 mg Per Tube q1800  . budesonide (PULMICORT) nebulizer solution  0.25 mg Nebulization BID  . buPROPion  50 mg Per Tube TID  . chlorhexidine gluconate (MEDLINE KIT)  15 mL Mouth Rinse BID  . Chlorhexidine Gluconate Cloth  6 each Topical Daily  . citalopram  20 mg Per Tube Daily  . clonazePAM  0.25 mg Per Tube Daily  . digoxin  0.125 mg Per Tube Daily  . ipratropium-albuterol  3 mL Nebulization TID  . mouth rinse  15 mL Mouth Rinse BID  . pantoprazole (PROTONIX) IV  40 mg Intravenous Q24H  . predniSONE  40 mg Per Tube Daily  . sodium chloride flush  10-40 mL Intracatheter  Q12H  . sodium chloride flush  3 mL Intravenous Q12H  . spironolactone  12.5 mg Per Tube Daily   Continuous Infusions: . sodium chloride Stopped (02/10/19 0520)  . dexmedetomidine (PRECEDEX) IV infusion Stopped (02/12/19 1838)  . doxycycline (VIBRAMYCIN) IV Stopped (02/12/19 2318)  . heparin 1,600 Units/hr (02/13/19 0851)  . norepinephrine (LEVOPHED) Adult infusion Stopped (02/13/19 0034)   PRN Meds:.sodium chloride, acetaminophen, albuterol, bisacodyl, docusate, fentaNYL, midazolam, ondansetron (ZOFRAN) IV, sodium chloride flush, sodium chloride flush  General appearance: alert, cooperative, appears older than stated age and mild distress Neurologic: intact Heart: regular rate and rhythm, S1, S2 normal, no murmur, click, rub or gallop Lungs: diminished breath sounds bilaterally and rhonchi bilaterally Abdomen: soft, non-tender; bowel sounds normal; no masses,  no organomegaly Extremities: bruised   Lab Results: CBC: Recent Labs    02/12/19 0350 02/12/19 0425 02/13/19 0409  WBC 18.6*  --  22.1*  HGB 11.9* 11.6* 11.6*  HCT 35.7* 34.0* 35.2*  PLT 282  --  299   BMET:  Recent Labs    02/12/19 0350 02/12/19 0425 02/13/19 0409  NA 137 135 131*  K 3.5 3.3* 4.5  CL 95*  --  90*  CO2  31  --  29  GLUCOSE 122*  --  192*  BUN 31*  --  35*  CREATININE 1.04  --  1.20  CALCIUM 8.9  --  9.0    CMET: Lab Results  Component Value Date   WBC 22.1 (H) 02/13/2019   HGB 11.6 (L) 02/13/2019   HCT 35.2 (L) 02/13/2019   PLT 299 02/13/2019   GLUCOSE 192 (H) 02/13/2019   CHOL 115 02/11/2019   TRIG 68 02/13/2019   HDL 36 (L) 02/04/2019   LDLCALC 71 02/24/2019   ALT 74 (H) 02/13/2019   AST 48 (H) 02/13/2019   NA 131 (L) 02/13/2019   K 4.5 02/13/2019   CL 90 (L) 02/13/2019   CREATININE 1.20 02/13/2019   BUN 35 (H) 02/13/2019   CO2 29 02/13/2019   INR 1.3 (H) 02/12/2019   HGBA1C 5.6 02/16/2019      PT/INR: No results for input(s): LABPROT, INR in the last 72 hours.  Radiology: No results found.   Assessment/Plan: S/P Procedure(s) (LRB): LEFT HEART CATH AND CORONARY ANGIOGRAPHY (N/A)    Patient's pulmonary status is poor with patient on home oxygen prior to admission. Current elevated white count of 22.1 and pulmonary status precludes coronary bypass surgery currently.   Grace Isaac MD      [address] [place] [address] Office [place]    Elgie Collard 02/13/2019 9:12 AM

## 2019-02-13 NOTE — Progress Notes (Signed)
NAME:  Brent Carter, MRN:  [id number], DOB:  1951/07/04, LOS: 4 ADMISSION DATE:  01/31/2019, CONSULTATION DATE:  02/08/2019 REFERRING MD:  [facility], CHIEF COMPLAINT:  STEMI  Brief History    68 year old man 102+ packyear active smoker with history of COPD, hypertension, hyperlipidemia, peripheral vascular disease, diverticular disease, sleep apnea (not on CPAP), anxiety presenting with dyspnea found to have STEMI s/p alteplase on 02/08/2019 at 2030 at [place]. Per EMR, patient arrived via EMS with dyspnea and respiratory distress. His dyspnea has been increasing the past 3 weeks and became acutely worse prior to arrival. He was found to be hypoxic with O2 sat in the 80s. He was also found to be wheezing. He was placed on CPAP. He was emergently intubated. No fevers or chest pain.   In the ED, he was found to have ST elevation in AVR with ST depression diffusely otherwise. CXR with RLL opacity. ED discussed with cardiology here and due to the STEMI and possibility of COVID, he was given thrombolytics for his STEMI. COVID testing came back negative. He received alteplase, ASA 318m. He had resolution of the ST changes.  Per [facility] report, he had cardiac arrest and received 4 min of CPR and Epi 128m No defibrillation. Per EMR appears to have had it prior to the alteplase.   He was seen by Dr. MaVaughan Browner/15/2020 for COPD and [facility]  Past Medical History  COPD, hypertension, hyperlipidemia, peripheral vascular disease, diverticular disease, sleep apnea (not on CPAP), anxiety  Significant Hospital Events   6/12>> Presented to ED 6/13>> [facility] - Weaning well this AM. Negative fluid balance overnight  Consults:  Cards 6/13>  Procedures:  CVC 6/13>  Significant Diagnostic Tests:  CXR 02/20/2019: asymmetric airspace disease in RLL, hyperinflation. No pneumothorax  EKG 02/08/2019: ST elevation in AVR, ST depression  diffusely  OSH labs 02/08/2019 WBC 14.4, Hgb 15.4, Plt 295, ANC 9.22 DDimer 6403 PT 11.3, INR 1.1 APTT 23.3 (@2015 ) ABG 7.29/53/406 Na 135, K 4.7, CO2 21, cr 0.9, BUN 15 Troponin 1.32 (@1906 ) NT-proBNP 4680 T bili 1, AST 303, ALT 201, alk phos 107 Procal 0.05 SARS-CoV-2 RNA not detected  Micro Data:  BCx 6/13>NTD Tracheal aspirate 6/13>NTD  Antimicrobials:  Ceftriaxone 6/12> Doxy 6/12>  Interim history/subjective:    6/17 - D3 diet per speech yesterday. On Iv heparin gtt. Off precedex. However, this AM - class 4 dyspnea, o2 needs up to 7L [place] (baseline 2L X 1 month)., incerased work of breathing  Objective   Blood pressure (!) 96/50, pulse 91, temperature 97.9 F (36.6 C), temperature source Oral, resp. rate 17, height 5' 5"  (1.651 m), weight 92.3 kg, SpO2 93 %. CVP:  [5 mmHg-10 mmHg] 9 mmHg      Intake/Output Summary (Last 24 hours) at 02/13/2019 0908 Last data filed at 02/13/2019 0700 Gross per 24 hour  Intake 2526.68 ml  Output 2595 ml  Net -68.32 ml   Filed Weights   02/11/19 0500 02/12/19 0500 02/13/19 0645  Weight: 92.9 kg 91.6 kg 92.3 kg     General Appearance:  Looks deconditioned Head:  Normocephalic, without obvious abnormality, atraumatic Eyes:  PERRL - yes, conjunctiva/corneas - muddy     Ears:  Normal external ear canals, both ears Nose:  G tube - no but has [place] Throat:  ETT TUBE - no , OG tube - no Neck:  Supple,  No enlargement/tenderness/nodules Lungs: Tachypneic, mildly paradoxical, not able to  complete full sentence, Distant air entry but audible. Not able to hear wheeze or crackles Heart:  S1 and S2 normal, no murmur, CVP - no.  Pressors - no Abdomen:  Soft, no masses, no organomegaly Genitalia / Rectal:  Not done Extremities:  Extremities- intact Skin:  ntact in exposed areas . Sacral area - no decub Neurologic:  Sedation - none -> RASS - +1 . Moves all 4s - yes. CAM-ICU - neg . Orientation - x3+      LABS    PULMONARY Recent Labs   Lab 02/10/19 1130 02/10/19 1559 02/10/19 1827 02/11/19 0256 02/11/19 1628 02/12/19 0425 02/12/19 0605 02/13/19 0425  PHART 7.268* 7.332* 7.324* 7.512*  --  7.534*  --   --   PCO2ART 58.7* 52.4* 50.1* 31.2*  --  41.0  --   --   PO2ART 87.0 77.0* 330.0* 74.0*  --  95.0  --   --   HCO3 26.8 27.8 26.1 25.0  --  34.6*  --   --   TCO2 29 29 28 26   --  36*  --   --   O2SAT 95.0 94.0 100.0 96.0 56.3 98.0 61.2 41.3    CBC Recent Labs  Lab 02/11/19 0305 02/12/19 0350 02/12/19 0425 02/13/19 0409  HGB 11.6* 11.9* 11.6* 11.6*  HCT 34.1* 35.7* 34.0* 35.2*  WBC 15.3* 18.6*  --  22.1*  PLT 256 282  --  299    COAGULATION Recent Labs  Lab 02/03/2019 0434  INR 1.3*    CARDIAC   Recent Labs  Lab 02/08/2019 0434 02/26/2019 1639 02/03/2019 2258  TROPONINI 42.10* 54.52* 29.52*   No results for input(s): PROBNP in the last 168 hours.   CHEMISTRY Recent Labs  Lab 02/10/19 0354  02/10/19 2352 02/11/19 0256 02/11/19 0305 02/11/19 2000 02/12/19 0350 02/12/19 0425 02/13/19 0409  NA 135   < > 137 137 137  --  137 135 131*  K 2.8*   < > 3.6 3.6 3.6  --  3.5 3.3* 4.5  CL 101  --  101  --  100  --  95*  --  90*  CO2 24  --  25  --  26  --  31  --  29  GLUCOSE 139*  --  143*  --  136*  --  122*  --  192*  BUN 21  --  25*  --  26*  --  31*  --  35*  CREATININE 1.07  --  1.16  --  1.13  --  1.04  --  1.20  CALCIUM 8.0*  --  8.1*  --  8.1*  --  8.9  --  9.0  MG 1.9  --  2.0  --  1.9  --  2.3  --  2.2  PHOS 1.4*  --   --   --  1.6* 2.8 2.7  --  5.9*   < > = values in this interval not displayed.   Estimated Creatinine Clearance: 60.6 mL/min (by C-G formula based on SCr of 1.2 mg/dL).   LIVER Recent Labs  Lab 02/25/2019 0434 02/10/19 0354 02/11/19 0305 02/12/19 0350 02/13/19 0409  AST 204* 99* 134* 73* 48*  ALT 186* 115* 102* 92* 74*  ALKPHOS 80 61 56 58 52  BILITOT 1.0 0.5 0.8 0.8 0.8  PROT 5.7* 4.9* 5.1* 5.5* 5.4*  ALBUMIN 2.9* 2.5* 2.6* 3.0* 2.9*  INR 1.3*  --   --   --    --  INFECTIOUS Recent Labs  Lab 02/10/2019 0434  LATICACIDVEN 1.2     ENDOCRINE CBG (last 3)  No results for input(s): GLUCAP in the last 72 hours.       IMAGING x48h  - image(s) personally visualized  -   highlighted in bold Dg Chest Port 1 View  Result Date: 02/12/2019 CLINICAL DATA:  ETT placement EXAM: PORTABLE CHEST 1 VIEW COMPARISON:  02/11/2019 FINDINGS: Endotracheal tube with the tip 5.1 cm above the carina. Nasogastric tube coursing below the diaphragm with the proximal port above the esophagogastric junction. Recommend advancing the nasogastric tube 10 cm. Right jugular central venous catheter with the tip projecting over the SVC. Bilateral lower lobe airspace disease, right greater than left. Bilateral interstitial and patchy alveolar airspace opacities. No pneumothorax. Trace left pleural effusion. Stable cardiomediastinal silhouette. No acute osseous abnormality. IMPRESSION: 1. Endotracheal tube with the tip 5.1 cm above the carina. 2. Nasogastric tube coursing below the diaphragm with the proximal port above the esophagogastric junction. Recommend advancing the nasogastric tube 10 cm. 3. Right jugular central venous catheter with the tip projecting over the SVC. 4. Multilobar pneumonia. Electronically Signed   By: Kathreen Devoid   On: 02/12/2019 08:20     Assessment & Plan:  69 year old man 96+ packyear active smoker with history of COPD, hypertension, hyperlipidemia, peripheral vascular disease, diverticular disease, sleep apnea (not on CPAP), anxiety presented with dyspnea found to have acute hypoxic and hypercarbic respiratory failure, 4 min cardiac arrest, STEMI s/p TPA on 02/08/2019 at 2030 and transferred here from [place].   ASSESSMENT / PLAN:  PULMONARY A:  #baseline - copd on 2L [place] # this admit 02/16/2019 - Acute hypoxic and hypercarbic respiratory failure due to AECOPD and CAP NOS - extubated 6/16/202  6/17 - resp distress post extubation +  PLAN  -  stat cxr  - start bipap - change prednisone to solumedrol - BD - IS - Flutter valve    NEUROLOGIC A:   On celexa, wellbutrin and klonopin at home  6/17 - neuro intact. Off precedex P:   Monitor Continue celexam wellburtin Dc fent prn Might need precedex restart in ICU setting    VASCULAR A:   Circulatory shock resolved  P:  MAP goal > 65 Hold home bP meeds  CARDIAC A:  STEMI/CAD, cardiac arrest: - , cath complete, needs CABG but CVTS would like to him to be extubated and ambulating prior to procedure    P: - Heparin gtt, asa, plavix - Hold off on statin for now as he has some transaminitis - TTE with very low EF - Active diureses per cards - Maintain as dry as able   INFECTIOUS A:  Possible CAP NOS - covid negative  P:   Check urine strep Check urine leg Check PCT  abx as below but taper based on above reslt  Antibiotics Given (last 72 hours)    Date/Time Action Medication Dose Rate   02/10/19 2033 New Bag/Given   cefTRIAXone (ROCEPHIN) 1 g in sodium chloride 0.9 % 100 mL IVPB 1 g 200 mL/hr   02/10/19 2110 New Bag/Given   doxycycline (VIBRAMYCIN) 100 mg in sodium chloride 0.9 % 250 mL IVPB 100 mg 125 mL/hr   02/11/19 0843 New Bag/Given   doxycycline (VIBRAMYCIN) 100 mg in sodium chloride 0.9 % 250 mL IVPB 100 mg 125 mL/hr   02/11/19 2030 New Bag/Given   cefTRIAXone (ROCEPHIN) 1 g in sodium chloride 0.9 % 100 mL IVPB 1 g 200 mL/hr  02/11/19 2135 New Bag/Given   doxycycline (VIBRAMYCIN) 100 mg in sodium chloride 0.9 % 250 mL IVPB 100 mg 125 mL/hr   02/12/19 2111 New Bag/Given   doxycycline (VIBRAMYCIN) 100 mg in sodium chloride 0.9 % 250 mL IVPB 100 mg 125 mL/hr   02/12/19 2002 New Bag/Given   cefTRIAXone (ROCEPHIN) 1 g in sodium chloride 0.9 % 100 mL IVPB 1 g 200 mL/hr   02/12/19 2113 New Bag/Given   doxycycline (VIBRAMYCIN) 100 mg in sodium chloride 0.9 % 250 mL IVPB 100 mg 125 mL/hr       RENAL A:  At risk AKI P:  monitor   ELECTROLYTES A:  At risk abnormal eleectrolyte P: monitor   GASTROINTESTINAL A:   Cleared for d3 diet 6/16  P:   Npo due to resp distress PPI  HEMATOLOGIC A:  At risk bleed Anemia of critical illnes   P:  - PRBC for hgb </= 8.0gm%  Due to MM     ENDOCRINE A:   At risk hypo and hyperglycemia   P:   ssi    Best practice:  Diet: NPO Pain/Anxiety/Delirium protocol (if indicated): see neuro  goal RASS 0 VAP protocol (if indicated): PPI daily DVT prophylaxis: hep gtt GI prophylaxis: PPI daily Glucose control: Monitor Mobility: OOB when able Code Status: FULL Family Communication: 6/17 - wife Aryaan Persichetti - updated. Explained about his respiratory decompensation Disposition: ICU     ATTESTATION & SIGNATURE   The patient Brent Carter is critically ill with multiple organ systems failure and requires high complexity decision making for assessment and support, frequent evaluation and titration of therapies, application of advanced monitoring technologies and extensive interpretation of multiple databases.   Critical Care Time devoted to patient care services described in this note is  30  Minutes. This time reflects time of care of this signee Dr Brand Males. This critical care time does not reflect procedure time, or teaching time or supervisory time of PA/NP/Med student/Med Resident etc but could involve care discussion time     Dr. Brand Males, M.D., [facility].C.P Pulmonary and Critical Care Medicine Staff Physician [place] [facility] Pager: [phone], If no answer or between  15:00h - 7:00h: call [phone]  02/13/2019 9:52 AM

## 2019-02-14 ENCOUNTER — Inpatient Hospital Stay (HOSPITAL_COMMUNITY): Payer: Medicare Other

## 2019-02-14 DIAGNOSIS — I469 Cardiac arrest, cause unspecified: Secondary | ICD-10-CM

## 2019-02-14 LAB — COMPREHENSIVE METABOLIC PANEL
ALT: 76 U/L — ABNORMAL HIGH (ref 0–44)
AST: 52 U/L — ABNORMAL HIGH (ref 15–41)
Albumin: 3 g/dL — ABNORMAL LOW (ref 3.5–5.0)
Alkaline Phosphatase: 47 U/L (ref 38–126)
Anion gap: 13 (ref 5–15)
BUN: 51 mg/dL — ABNORMAL HIGH (ref 8–23)
CO2: 32 mmol/L (ref 22–32)
Calcium: 8.9 mg/dL (ref 8.9–10.3)
Chloride: 88 mmol/L — ABNORMAL LOW (ref 98–111)
Creatinine, Ser: 1.47 mg/dL — ABNORMAL HIGH (ref 0.61–1.24)
GFR calc Af Amer: 56 mL/min — ABNORMAL LOW (ref >60)
GFR calc non Af Amer: 48 mL/min — ABNORMAL LOW (ref >60)
Glucose, Bld: 146 mg/dL — ABNORMAL HIGH (ref 70–99)
Potassium: 3.5 mmol/L (ref 3.5–5.1)
Sodium: 133 mmol/L — ABNORMAL LOW (ref 135–145)
Total Bilirubin: 1.1 mg/dL (ref 0.3–1.2)
Total Protein: 5.6 g/dL — ABNORMAL LOW (ref 6.5–8.1)

## 2019-02-14 LAB — CBC
HCT: 31.6 % — ABNORMAL LOW (ref 39.0–52.0)
Hemoglobin: 10.6 g/dL — ABNORMAL LOW (ref 13.0–17.0)
MCH: 30.9 pg (ref 26.0–34.0)
MCHC: 33.5 g/dL (ref 30.0–36.0)
MCV: 92.1 fL (ref 80.0–100.0)
Platelets: 340 10*3/uL (ref 150–400)
RBC: 3.43 MIL/uL — ABNORMAL LOW (ref 4.22–5.81)
RDW: 14.1 % (ref 11.5–15.5)
WBC: 23.6 10*3/uL — ABNORMAL HIGH (ref 4.0–10.5)
nRBC: 0.6 % — ABNORMAL HIGH (ref 0.0–0.2)

## 2019-02-14 LAB — CULTURE, BLOOD (ROUTINE X 2)
Culture: NO GROWTH
Culture: NO GROWTH
Special Requests: ADEQUATE

## 2019-02-14 LAB — PROTIME-INR
INR: 1.3 — ABNORMAL HIGH (ref 0.8–1.2)
Prothrombin Time: 15.9 seconds — ABNORMAL HIGH (ref 11.4–15.2)

## 2019-02-14 LAB — TRIGLYCERIDES: Triglycerides: 68 mg/dL (ref <150)

## 2019-02-14 LAB — COOXEMETRY PANEL
Carboxyhemoglobin: 1.4 % (ref 0.5–1.5)
Methemoglobin: 1.1 % (ref 0.0–1.5)
O2 Saturation: 54 %
Total hemoglobin: 10.7 g/dL — ABNORMAL LOW (ref 12.0–16.0)

## 2019-02-14 LAB — LEGIONELLA PNEUMOPHILA SEROGP 1 UR AG: L. pneumophila Serogp 1 Ur Ag: NEGATIVE

## 2019-02-14 LAB — PROCALCITONIN: Procalcitonin: 0.55 ng/mL

## 2019-02-14 LAB — HEPARIN LEVEL (UNFRACTIONATED): Heparin Unfractionated: 0.53 IU/mL (ref 0.30–0.70)

## 2019-02-14 LAB — MAGNESIUM: Magnesium: 2.1 mg/dL (ref 1.7–2.4)

## 2019-02-14 LAB — PHOSPHORUS: Phosphorus: 4.9 mg/dL — ABNORMAL HIGH (ref 2.5–4.6)

## 2019-02-14 MED ORDER — IPRATROPIUM-ALBUTEROL 0.5-2.5 (3) MG/3ML IN SOLN: 3.0000 mL | RESPIRATORY_TRACT | Status: DC | PRN

## 2019-02-14 MED ORDER — FENTANYL CITRATE (PF) 100 MCG/2ML IJ SOLN
INTRAMUSCULAR | Status: AC
  Administered 2019-02-14: 100 ug
  Filled 2019-02-14: qty 2

## 2019-02-14 MED ORDER — EPINEPHRINE PF 1 MG/ML IJ SOLN
0.5000 ug/min | INTRAVENOUS | Status: DC
  Filled 2019-02-14: qty 4

## 2019-02-14 MED ORDER — METHYLPREDNISOLONE SODIUM SUCC 40 MG IJ SOLR: 20.0000 mg | Freq: Two times a day (BID) | INTRAMUSCULAR | Status: DC

## 2019-02-14 MED ORDER — VITAL HIGH PROTEIN PO LIQD: 1000.0000 mL | ORAL | Status: DC

## 2019-02-14 MED ORDER — DEXMEDETOMIDINE HCL IN NACL 200 MCG/50ML IV SOLN
0.4000 ug/kg/h | INTRAVENOUS | Status: DC
  Administered 2019-02-14: 07:00:00 0.4 ug/kg/h via INTRAVENOUS

## 2019-02-14 MED ORDER — DOCUSATE SODIUM 50 MG/5ML PO LIQD: 100.0000 mg | Freq: Two times a day (BID) | ORAL | Status: DC | PRN

## 2019-02-14 MED ORDER — FENTANYL CITRATE (PF) 100 MCG/2ML IJ SOLN: 50.0000 ug | Freq: Once | INTRAMUSCULAR | Status: DC

## 2019-02-14 MED ORDER — FENTANYL CITRATE (PF) 100 MCG/2ML IJ SOLN: 25.0000 ug | INTRAMUSCULAR | Status: DC | PRN

## 2019-02-14 MED ORDER — BISACODYL 10 MG RE SUPP: 10.0000 mg | Freq: Every day | RECTAL | Status: DC | PRN

## 2019-02-14 MED ORDER — ORAL CARE MOUTH RINSE: 15.0000 mL | OROMUCOSAL | Status: DC

## 2019-02-14 MED ORDER — CHLORHEXIDINE GLUCONATE 0.12% ORAL RINSE (MEDLINE KIT): 15.0000 mL | Freq: Two times a day (BID) | OROMUCOSAL | Status: DC

## 2019-02-14 MED ORDER — MIDAZOLAM HCL 2 MG/2ML IJ SOLN
INTRAMUSCULAR | Status: AC
  Administered 2019-02-14: 2 mg
  Filled 2019-02-14: qty 2

## 2019-02-14 MED ORDER — DEXMEDETOMIDINE HCL IN NACL 200 MCG/50ML IV SOLN
INTRAVENOUS | Status: AC
  Administered 2019-02-14: 07:00:00 0.4 ug/kg/h via INTRAVENOUS
  Filled 2019-02-14: qty 50

## 2019-02-14 MED ORDER — ROCURONIUM BROMIDE 50 MG/5ML IV SOLN
50.0000 mg | Freq: Once | INTRAVENOUS | Status: AC
  Administered 2019-02-14: 50 mg via INTRAVENOUS

## 2019-02-14 MED ORDER — POTASSIUM CHLORIDE CRYS ER 20 MEQ PO TBCR: 40.0000 meq | EXTENDED_RELEASE_TABLET | Freq: Once | ORAL | Status: DC

## 2019-02-14 MED ORDER — MIDAZOLAM HCL 2 MG/2ML IJ SOLN: 1.0000 mg | INTRAMUSCULAR | Status: DC | PRN

## 2019-02-14 MED ORDER — POTASSIUM CHLORIDE 10 MEQ/50ML IV SOLN
10.0000 meq | INTRAVENOUS | Status: AC
  Filled 2019-02-14: qty 50

## 2019-02-14 MED ORDER — ETOMIDATE 2 MG/ML IV SOLN
10.0000 mg | Freq: Once | INTRAVENOUS | Status: AC
  Administered 2019-02-14: 08:00:00 10 mg via INTRAVENOUS

## 2019-02-14 MED ORDER — MIDAZOLAM HCL 2 MG/2ML IJ SOLN: 2.0000 mg | Freq: Once | INTRAMUSCULAR | Status: DC

## 2019-02-14 MED FILL — Medication: Qty: 1 | Status: AC

## 2019-02-15 ENCOUNTER — Telehealth: Payer: Self-pay | Admitting: *Deleted

## 2019-02-15 NOTE — Telephone Encounter (Signed)
Received faxed copy of D/C-D/C fore warded to Dt.Sood (4N) to sign for [employer]	[place].

## 2019-02-18 ENCOUNTER — Telehealth: Payer: Self-pay

## 2019-02-18 NOTE — Telephone Encounter (Signed)
Received signed dc back from Doctor Sood.  I called the funeral home to let them know I faxed a copy to the funeral home per their request.

## 2019-02-20 ENCOUNTER — Telehealth: Payer: Self-pay

## 2019-02-20 NOTE — Telephone Encounter (Signed)
Received dc from [facility].  Dc is for cremation and a patient of Doctor Nelda Marseille.   DC will be taken to [facility] for signature.

## 2019-02-27 NOTE — Telephone Encounter (Signed)
Received original signed D/C-D/C forwarded to [place] Dept.  As requested

## 2019-02-27 NOTE — Procedures (Signed)
Intubation Procedure Note Brent Carter [id number] November 08, 1950  Procedure: Intubation Indications: worsening shock, respiratory distress, and imminent arrest   Procedure Details Consent: Risks of procedure as well as the alternatives and risks of each were explained to the (patient/caregiver).  Consent for procedure obtained. Time Out: Verified patient identification, verified procedure, site/side was marked, verified correct patient position, special equipment/implants available, medications/allergies/relevent history reviewed, required imaging and test results available.  Performed  Patient on levophed at 40 mcg/min at time of induction RSI with versed 28m, Fentanyl 50 mcg, etomidate 10 mg and rocuronium 524m  Maximum sterile technique was used including cap, gloves and hand hygiene.  MAC and 4  Glidescope.  A 7.5 ETT was placed to 25 cm secured at lip with commercial tube holder.  Positive ETCO2 color change and continuous ETCO2 monitoring.   Evaluation Hemodynamic Status:  Patient unfortunately decompensated to cardiac arrest during RSI.  Patient was then intubated during pulse check.  Patient's Current Condition: unstable Complications: Complications of cardiac arrest Patient did not tolerate procedure well. Chest X-ray ordered to verify placement.  CXR: pending.  Procedure performed under direct supervision of Dr. SoHalford Chessmant bedside.    BrKennieth RadMSN, AGACNP-BC [place] [facility] Pgr: [phone] if no answer [phone]/03/07/202010:17 AM

## 2019-02-27 NOTE — Progress Notes (Signed)
This RN arrived to see patient during report. Patient's BP 60s/40s and HR 120s. Patient agitated on BiPAP with O2 sats 90% and struggling to breathe at 55% FiO2. Kennieth Rad, NP to the bedside to assess. Orders to emergently intubate and to get an EKG. EKG done and appeared different from previous ones. Bensimhon MD paged to bedside to assess. As RSI medications were being administered, patient quickly went asystole and a code blue was called. See code sheet for interventions. Patient pronounced at 0847 by Bensimhon MD. Family notified.

## 2019-02-27 NOTE — Discharge Summary (Signed)
DISCHARGE SUMMARY    Date of admit: 02/23/2019  3:15 AM Date of discharge: 02/19/2019 12:57 PM Length of Stay: 5 days  PCP is Cox, Kirsten, MD   PROBLEM LIST Principal Problem:   STEMI (ST elevation myocardial infarction) ([place]) Active Problems:   COPD with acute exacerbation ([place])   Acute respiratory failure with hypoxia and hypercapnia ([place])    SUMMARY Brent Carter was 68 y.o. patient with    has a past medical history of Anxiety, COPD (chronic obstructive pulmonary disease) ([place]), Hypertension, and OSA (obstructive sleep apnea).   has a past surgical history that includes LEFT HEART CATH AND CORONARY ANGIOGRAPHY (N/A, 02/15/2019).   Admitted on 02/15/2019 with  68 year old man 70+ packyear active smoker with history of COPD, hypertension, hyperlipidemia, peripheral vascular disease, diverticular disease, sleep apnea (not on CPAP), anxiety presenting with dyspnea found to have STEMI s/p alteplase on 02/08/2019 at 2030 at [place]. Per EMR, patient arrived via EMS with dyspnea and respiratory distress. His dyspnea has been increasing the past 3 weeks and became acutely worse prior to arrival. He was found to be hypoxic with O2 sat in the 80s. He was also found to be wheezing. He was placed on CPAP. He was emergently intubated. No fevers or chest pain.   In the ED, he was found to have ST elevation in AVR with ST depression diffusely otherwise. CXR with RLL opacity. ED discussed with cardiology here and due to the STEMI and possibility of COVID, he was given thrombolytics for his STEMI. COVID testing came back negative. He received alteplase, ASA 300mg . He had resolution of the ST changes.  Per [facility] report, he had cardiac arrest and received 4 min of CPR and Epi 1mg . No defibrillation. Per EMR appears to have had it prior to the alteplase.   He was seen by Dr. Vaughan Browner 01/11/2019 for COPD and recently switched from [facility] to Trelegy.    EVENTS 6/12>> Presented to  ED 6/13>> Transferred from [place] to [facility]. Cath shows he needs CABG 6/16 - extubated 6/17 - needed bipap.  6/18 - worsening resp distress, cardiogenic shock and cardiac arrest and did not survive  Expired 2019/02/19    SIGNED Dr. Brand Males, M.D., F.C.C.P Pulmonary and Critical Care Medicine Staff Physician [place] [facility] Pager: [phone], If no answer or between  15:00h - 7:00h: call [phone]  02/21/2019 6:17 PM

## 2019-02-27 NOTE — Progress Notes (Signed)
Spoke with patient's wife who stated that they were taking care of things today and didn't feel comfortable coming to the hospital to see patient. Necessary information gotten and placed in the chart.

## 2019-02-27 NOTE — Progress Notes (Signed)
eLink Physician-Brief Progress Note Patient Name: Brent Carter DOB: 1951-02-10 MRN: [id number]   Date of Service  02/15/2019  HPI/Events of Note  Anxiety  eICU Interventions  RN noted patient has had issues with anxiety and did well on precedx which has an order but it is expired Anxious and heart rte up in 110s Will resume precedex since he is on BIPAP and did well on it previously     Intervention Category Minor Interventions: Agitation / anxiety - evaluation and management  Margaretmary Lombard 01/31/2019, 6:44 AM

## 2019-02-27 NOTE — Procedures (Signed)
Intubation Procedure Note Brent Carter [id number] 1950-09-22  Procedure: Intubation Indications: Airway protection and maintenance  Procedure Details Consent: Unable to obtain consent because of emergent medical necessity. Time Out: Verified patient identification, verified procedure, site/side was marked, verified correct patient position, special equipment/implants available, medications/allergies/relevent history reviewed, required imaging and test results available.  Performed  Maximum sterile technique was used including gloves, hand hygiene and mask.  MAC and 4    Evaluation Hemodynamic Status: Persistent hypotension treated with pressors and fluid; O2 sats: stable throughout Patient's Current Condition: unstable Complications: No apparent complications Patient did not tolerate procedure well. Chest X-ray ordered to verify placement.  CXR: pending.   Lynann Bologna 02/17/19

## 2019-02-27 NOTE — Progress Notes (Signed)
  CTSP emergently for acute decompensation.   Patient developed worsening SOB and hypotension despite NE and milrinone support.   On my arrival. Patient in bed about to be intubated by CCM team with Dr. Halford Chessman at bedside.  Patient then developed asystolic arrest. We performed immediate CPR with multiple rounds of EPIA and Bicarb. ROSC achieved about 10 minutes into code.   Epi drip started and I contacted family personally. Family decided on maintaining full code status. While I was on the phone with family he developed recurrent asystolic arrest. CPR resumed immediately. Dr. Chase Caller (CCM) and myself ran code. Multiple rounds of epi and bicarb given. Despite aggressive resuscitative efforts we were unable to achieve ROSC and patient with persistent asystole.   Code called at 847a and patient pronounced. I notified family personally.   Total CCT 60 mins.   Glori Bickers, MD  8:58 AM

## 2019-02-27 NOTE — Progress Notes (Signed)
NAME:  Brent Carter, MRN:  [id number], DOB:  [birth year], LOS: 5 ADMISSION DATE:  02/11/2019, CONSULTATION DATE:  02/16/2019 REFERRING MD:  [facility], CHIEF COMPLAINT:  STEMI  Brief History    68 year old man 67+ packyear active smoker with history of COPD, hypertension, hyperlipidemia, peripheral vascular disease, diverticular disease, sleep apnea (not on CPAP), anxiety presenting with dyspnea found to have STEMI s/p alteplase on 02/08/2019 at 2030 at [place]. Per EMR, patient arrived via EMS with dyspnea and respiratory distress. His dyspnea has been increasing the past 3 weeks and became acutely worse prior to arrival. He was found to be hypoxic with O2 sat in the 80s. He was also found to be wheezing. He was placed on CPAP. He was emergently intubated. No fevers or chest pain.   In the ED, he was found to have ST elevation in AVR with ST depression diffusely otherwise. CXR with RLL opacity. ED discussed with cardiology here and due to the STEMI and possibility of COVID, he was given thrombolytics for his STEMI. COVID testing came back negative. He received alteplase, ASA 360m. He had resolution of the ST changes.  Per [facility] report, he had cardiac arrest and received 4 min of CPR and Epi 117m No defibrillation. Per EMR appears to have had it prior to the alteplase.   He was seen by Dr. MaVaughan Browner/15/2020 for COPD and [facility].  Past Medical History  COPD, hypertension, hyperlipidemia, peripheral vascular disease, diverticular disease, sleep apnea (not on CPAP), anxiety  Significant Hospital Events   6/12>> Presented to ED 6/13>> [facility] >> extubated/ reintubated 6/16 >> extubated 6/17 D3 diet per speech yesterday. On Iv heparin gtt. Off precedex. However, this AM - class 4 dyspnea, o2 needs up to 7L [place] (baseline 2L X 1 month)., incerased work of breathing-> BiPAP  Consults:  Cards 6/13> TCTS  Procedures:  CVC  6/13> ETT 6/12 >> 6/14;  6/14 >> 6/16;   Significant Diagnostic Tests:  CXR 02/08/2019: asymmetric airspace disease in RLL, hyperinflation. No pneumothorax  EKG 02/08/2019: ST elevation in AVR, ST depression diffusely  OSH labs 02/08/2019 WBC 14.4, Hgb 15.4, Plt 295, ANC 9.22 DDimer 6403 PT 11.3, INR 1.1 APTT 23.3 (@2015 ) ABG 7.29/53/406 Na 135, K 4.7, CO2 21, cr 0.9, BUN 15 Troponin 1.32 (@1906 ) NT-proBNP 4680 T bili 1, AST 303, ALT 201, alk phos 107 Procal 0.05 SARS-CoV-2 RNA not detected  Micro Data:  BCx 6/13>NTD Tracheal aspirate 6/13>NTD  Antimicrobials:  Ceftriaxone 6/12> Doxy 6/12>  Interim history/subjective:   Decompensation over last 2 hours, becoming more tachycardic- more wide complex/ increased work of breathing, levophed went from 5- to now 30 mcg/min Coox- 54 CVP 11  Patient complaining of worsening SOB and epigastric pain  Objective   Blood pressure (!) 66/51, pulse (!) 122, temperature (!) 97 F (36.1 C), temperature source Axillary, resp. rate 19, height 5' 5"  (1.651 m), weight 92.3 kg, SpO2 91 %. CVP:  [4 mmHg-8 mmHg] 4 mmHg  FiO2 (%):  [40 %-60 %] 55 %   Intake/Output Summary (Last 24 hours) at 6/06-28-2020756 Last data filed at 6/28-Jun-2020700 Gross per 24 hour  Intake 1252.62 ml  Output 2300 ml  Net -1047.38 ml   Filed Weights   02/11/19 0500 02/12/19 0500 02/13/19 0645  Weight: 92.9 kg 91.6 kg 92.3 kg   General:  Ill appearing male in respiratory distress on BiPAP HEENT: Full face mask, +JVD Neuro: Alert/  oriented x 3, MAE CV: RR, ST- weak peripheral pulses PULM: labored on BiPAP 15/5 getting ~500 TV/ rate 30 GI: distended, +bs  Extremities: cold/dry, no LE edema  Skin: no rashes, bruising to BUE   LABS    PULMONARY Recent Labs  Lab 02/10/19 1130 02/10/19 1559 02/10/19 1827 02/11/19 0256  02/12/19 0425 02/12/19 0605 02/13/19 0425 02/13/19 0920 02/13/19 1243 2019/02/15 0430  PHART 7.268* 7.332* 7.324* 7.512*  --  7.534*   --   --   --   --   --   PCO2ART 58.7* 52.4* 50.1* 31.2*  --  41.0  --   --   --   --   --   PO2ART 87.0 77.0* 330.0* 74.0*  --  95.0  --   --   --   --   --   HCO3 26.8 27.8 26.1 25.0  --  34.6*  --   --   --   --   --   TCO2 29 29 28 26   --  36*  --   --   --   --   --   O2SAT 95.0 94.0 100.0 96.0   < > 98.0 61.2 41.3 49.2 57.1 54.0   < > = values in this interval not displayed.    CBC Recent Labs  Lab 02/12/19 0350 02/12/19 0425 02/13/19 0409 15-Feb-2019 0419  HGB 11.9* 11.6* 11.6* 10.6*  HCT 35.7* 34.0* 35.2* 31.6*  WBC 18.6*  --  22.1* 23.6*  PLT 282  --  299 340    COAGULATION Recent Labs  Lab 02/06/2019 0434 02/15/19 0419  INR 1.3* 1.3*    CARDIAC   Recent Labs  Lab 02/11/2019 0434 02/13/2019 1639 02/03/2019 2258  TROPONINI 42.10* 54.52* 29.52*   No results for input(s): PROBNP in the last 168 hours.   CHEMISTRY Recent Labs  Lab 02/10/19 2352  02/11/19 0305 02/11/19 2000 02/12/19 0350 02/12/19 0425 02/13/19 0409 02/13/19 1847 2019/02/15 0419  NA 137   < > 137  --  137 135 131* 133* 133*  K 3.6   < > 3.6  --  3.5 3.3* 4.5 3.8 3.5  CL 101  --  100  --  95*  --  90* 88* 88*  CO2 25  --  26  --  31  --  29 30 32  GLUCOSE 143*  --  136*  --  122*  --  192* 148* 146*  BUN 25*  --  26*  --  31*  --  35* 49* 51*  CREATININE 1.16  --  1.13  --  1.04  --  1.20 1.62* 1.47*  CALCIUM 8.1*  --  8.1*  --  8.9  --  9.0 9.0 8.9  MG 2.0  --  1.9  --  2.3  --  2.2  --  2.1  PHOS  --   --  1.6* 2.8 2.7  --  5.9*  --  4.9*   < > = values in this interval not displayed.   Estimated Creatinine Clearance: 49.5 mL/min (A) (by C-G formula based on SCr of 1.47 mg/dL (H)).   LIVER Recent Labs  Lab 02/18/2019 0434 02/10/19 0354 02/11/19 0305 02/12/19 0350 02/13/19 0409 02-15-2019 0419  AST 204* 99* 134* 73* 48* 52*  ALT 186* 115* 102* 92* 74* 76*  ALKPHOS 80 61 56 58 52 47  BILITOT 1.0 0.5 0.8 0.8 0.8 1.1  PROT 5.7* 4.9* 5.1* 5.5* 5.4* 5.6*  ALBUMIN  2.9* 2.5* 2.6* 3.0* 2.9*  3.0*  INR 1.3*  --   --   --   --  1.3*     INFECTIOUS Recent Labs  Lab 02/04/2019 0434 28-Feb-2019 0419  LATICACIDVEN 1.2  --   PROCALCITON  --  0.55     ENDOCRINE CBG (last 3)  No results for input(s): GLUCAP in the last 72 hours.   IMAGING x48h  - image(s) personally visualized  -   highlighted in bold No results found.   Assessment & Plan:  68 year old man 69+ packyear active smoker with history of COPD, hypertension, hyperlipidemia, peripheral vascular disease, diverticular disease, sleep apnea (not on CPAP), anxiety presented with dyspnea found to have acute hypoxic and hypercarbic respiratory failure, 4 min cardiac arrest, STEMI s/p TPA on 02/08/2019 at 2030 and transferred here from [place].  Found to have severe 3 vessel disease on cardiac cath with EF 30%.     ASSESSMENT / PLAN:  PULMONARY A:  Acute hypoxic respiratory failure AECOPD R/oCAP - CXR 6/18 with worsening pulmonary edema - baseline - copd on 2L [place] - intubated on 6/12 and 6/14 this admit; most recently extubated 6/16  PLAN Worsening respiratory distress Will intubate now - full MV support Continue duonebs, reduce steroids  VAP bundle   NEUROLOGIC A:   On celexa, wellbutrin and klonopin at home 6/18 - neuro intact prior to intubation P:   Monitor hold celexam wellburtin PAD protocol with fentanyl/ versed, RASS goal 0-/1   VASCULAR/ CARDIAC A:   Cardiogenic shock  +/- component of sepsis/?CAP STEMI/CAD, cardiac arrest: - , cath complete, needs CABG but CVTS would like to him to be extubated and ambulating prior to procedure P:  Continue levophed MAP goal > 65 HF team following Continuing milrinone/ digoxin per HF  Continue trend co-ox/ CVP Heparin gtt, asa, plavix Diureses per cards Goal K >4/ Mag >2  INFECTIOUS A:  Possible CAP NOS - covid negative  P:   urine strep neg pending urine leg PCT -0.55 abx as below but taper based on above reslt  Antibiotics Given (last 72 hours)     Date/Time Action Medication Dose Rate   02/11/19 0843 New Bag/Given   doxycycline (VIBRAMYCIN) 100 mg in sodium chloride 0.9 % 250 mL IVPB 100 mg 125 mL/hr   02/11/19 2030 New Bag/Given   cefTRIAXone (ROCEPHIN) 1 g in sodium chloride 0.9 % 100 mL IVPB 1 g 200 mL/hr   02/11/19 2135 New Bag/Given   doxycycline (VIBRAMYCIN) 100 mg in sodium chloride 0.9 % 250 mL IVPB 100 mg 125 mL/hr   02/12/19 5361 New Bag/Given   doxycycline (VIBRAMYCIN) 100 mg in sodium chloride 0.9 % 250 mL IVPB 100 mg 125 mL/hr   02/12/19 2002 New Bag/Given   cefTRIAXone (ROCEPHIN) 1 g in sodium chloride 0.9 % 100 mL IVPB 1 g 200 mL/hr   02/12/19 2113 New Bag/Given   doxycycline (VIBRAMYCIN) 100 mg in sodium chloride 0.9 % 250 mL IVPB 100 mg 125 mL/hr   02/13/19 1035 New Bag/Given   doxycycline (VIBRAMYCIN) 100 mg in sodium chloride 0.9 % 250 mL IVPB 100 mg 125 mL/hr   02/13/19 2140 New Bag/Given   doxycycline (VIBRAMYCIN) 100 mg in sodium chloride 0.9 % 250 mL IVPB 100 mg 125 mL/hr     RENAL A:  AKI- improving renal function P:  Continue foley Trend BMP / urinary output Replace electrolytes as indicated   GASTROINTESTINAL A:   Cleared for d3 diet 6/16  P:  NPO now, start TF  PPI   HEMATOLOGIC A:  At risk bleed Anemia of critical illnes  P:  - PRBC for hgb </= 8.0gm%  Due to MM   ENDOCRINE A:   At risk hypo and hyperglycemia   P:   SSI/ CBG q 4   Best practice:  Diet: NPO Pain/Anxiety/Delirium protocol (if indicated): Fent/ Versed  goal RASS 0/-1 VAP protocol (if indicated): PPI daily DVT prophylaxis: hep gtt GI prophylaxis: PPI daily Glucose control: Monitor Mobility: BR Code Status: FULL Family Communication: pending Disposition: ICU   CCT 55 min   Kennieth Rad, MSN, AGACNP-BC [facility] Pgr: [phone] or if no answer [phone] 02-15-19, 7:56 AM

## 2019-02-27 DEATH — deceased

## 2020-11-29 IMAGING — DX CHEST - 2 VIEW
2 series · 2 of 2 positions shown · non-contrast
Comparison: 09/13/2018

CLINICAL DATA: COPD follow-up

EXAM:
CHEST - 2 VIEW

[chest pa]
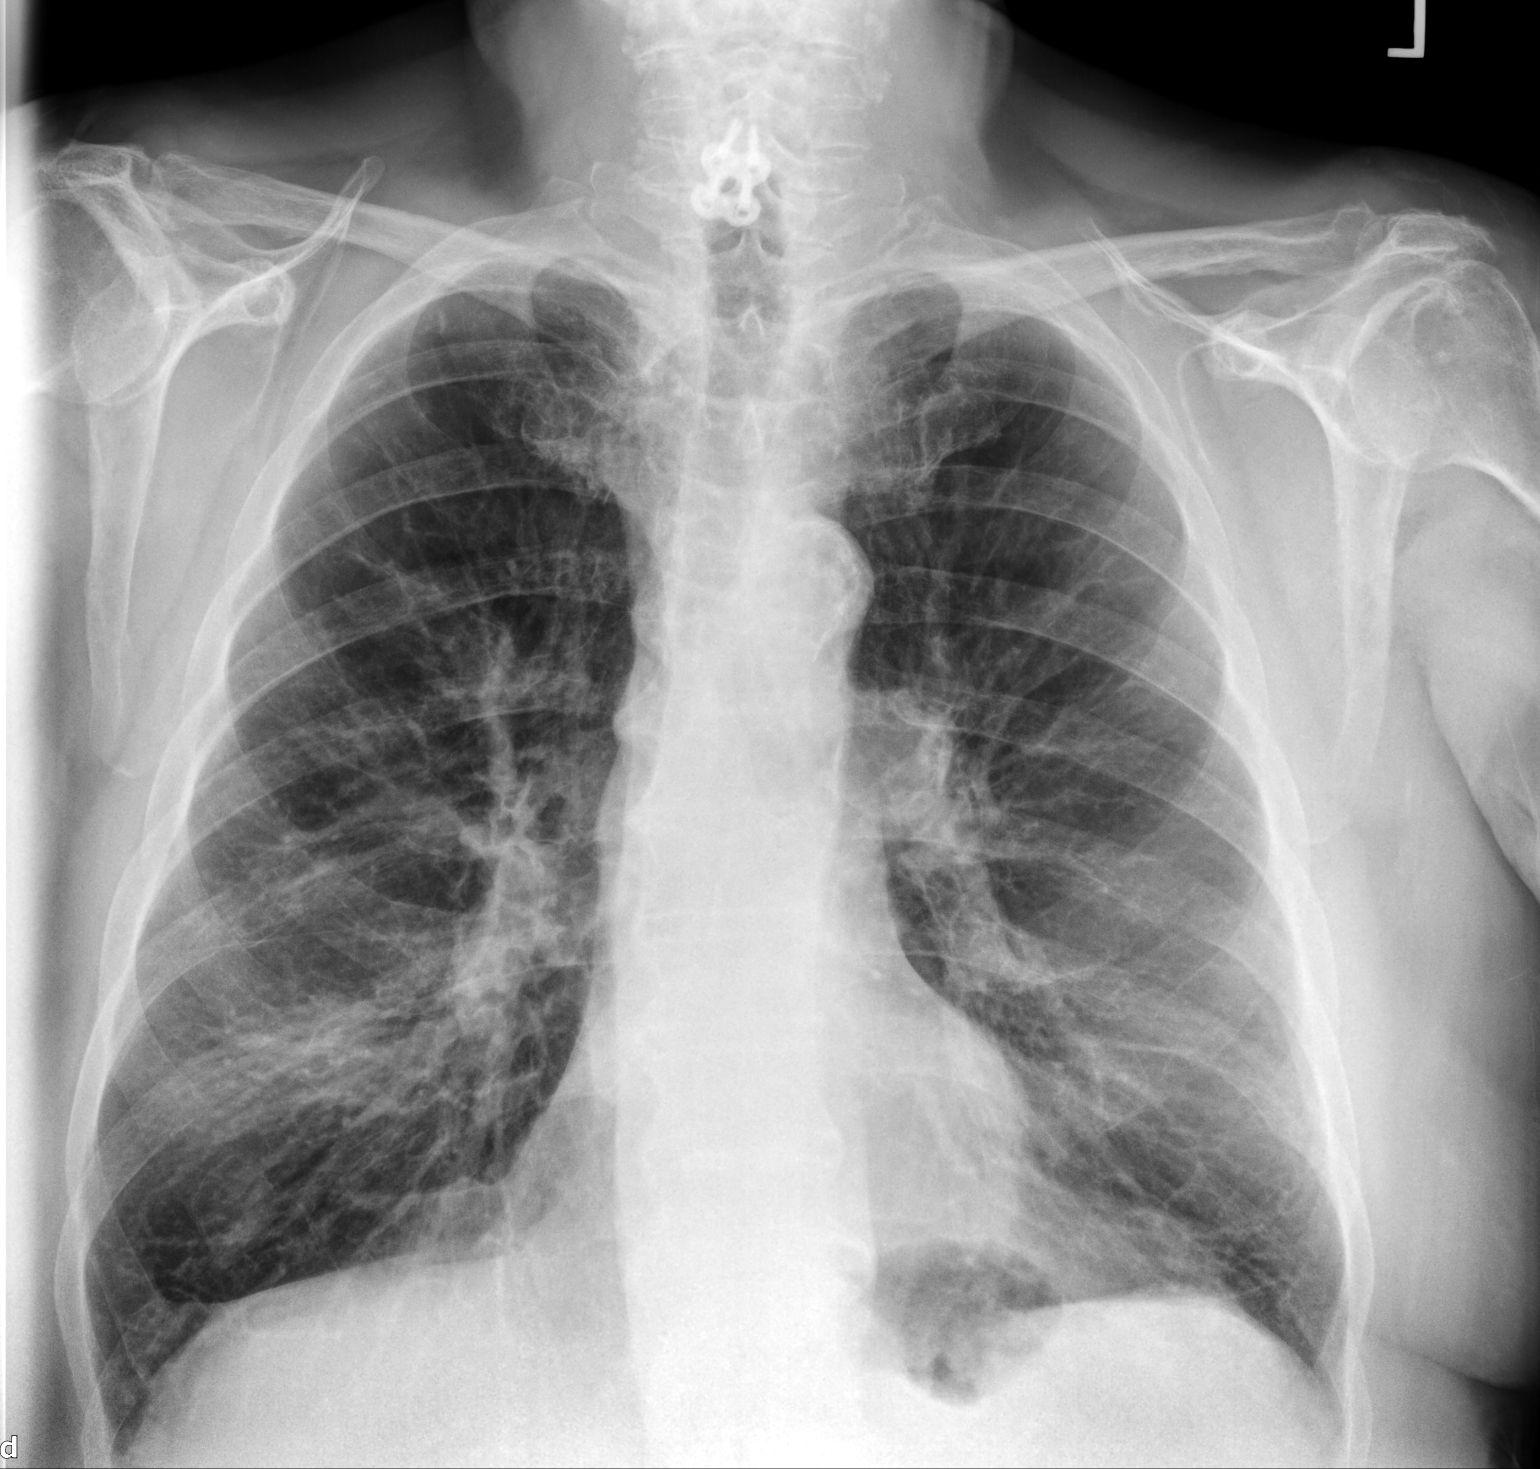

[chest lat]
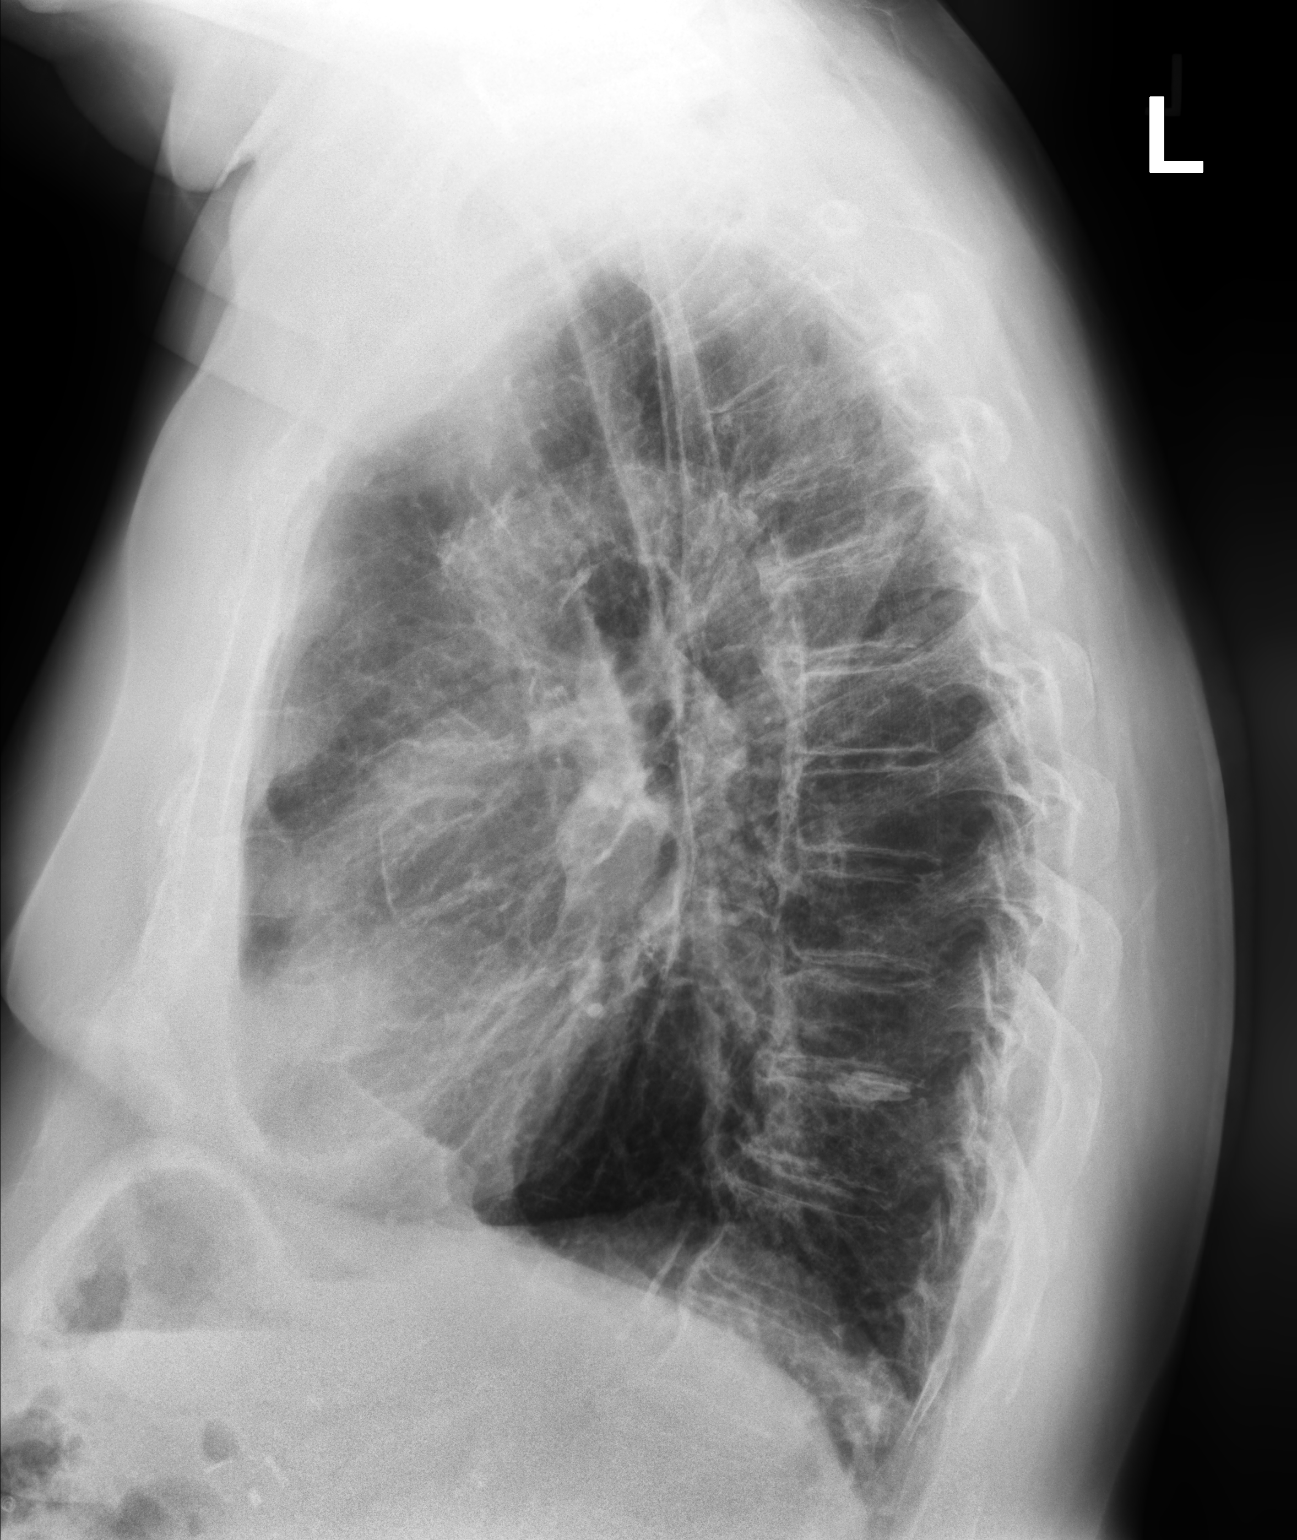

[2 of 2 positions shown; findings below may reference images not displayed]

FINDINGS: Normal heart size. Aortic atherosclerosis. Aortic atherosclerosis.
Lungs are hyperinflated and there are coarsened interstitial
markings of COPD/emphysema. No superimposed airspace consolidation.
The visualized osseous structures are unremarkable.
IMPRESSION: 1. No acute cardiopulmonary abnormality.
2. COPD/emphysema.
